# Patient Record
Sex: Female | Born: 1960 | Hispanic: No | Marital: Married | Smoking: Never smoker
Health system: Southern US, Community
[De-identification: ages and names within clinical notes are randomized; demographics above are authoritative.]

## PROBLEM LIST (undated history)

## (undated) DIAGNOSIS — F32A Depression, unspecified: Secondary | ICD-10-CM

## (undated) DIAGNOSIS — E039 Hypothyroidism, unspecified: Secondary | ICD-10-CM

## (undated) DIAGNOSIS — F329 Major depressive disorder, single episode, unspecified: Secondary | ICD-10-CM

## (undated) HISTORY — DX: Depression, unspecified: F32.A

## (undated) HISTORY — DX: Major depressive disorder, single episode, unspecified: F32.9

## (undated) HISTORY — PX: SPLENECTOMY: SUR1306

---

## 1982-05-05 HISTORY — PX: TONSILLECTOMY: SUR1361

## 2012-11-22 ENCOUNTER — Ambulatory Visit (INDEPENDENT_AMBULATORY_CARE_PROVIDER_SITE_OTHER): Payer: BC Managed Care – PPO | Admitting: Endocrinology

## 2012-11-22 ENCOUNTER — Encounter: Payer: Self-pay | Admitting: Endocrinology

## 2012-11-22 VITALS — BP 160/80 | HR 88 | Temp 98.9°F | Resp 12 | Ht 70.0 in | Wt 280.9 lb

## 2012-11-22 DIAGNOSIS — E039 Hypothyroidism, unspecified: Secondary | ICD-10-CM

## 2012-11-22 DIAGNOSIS — F329 Major depressive disorder, single episode, unspecified: Secondary | ICD-10-CM

## 2012-11-22 DIAGNOSIS — R030 Elevated blood-pressure reading, without diagnosis of hypertension: Secondary | ICD-10-CM

## 2012-11-22 NOTE — Patient Instructions (Addendum)
Reduce dose to 1/2 tab on Sundays

## 2012-11-22 NOTE — Progress Notes (Signed)
Patient ID: Leslie Hunter, female   DOB: [birth year], 52 y.o.   MRN: [id number]  Reason for Appointment:  Hypothyroidism, new consultation    History of Present Illness:   Her HYPOTHYROIDISM was first diagnosed at age 16 with symptoms of fatigue, sleepiness, lethargy, constipation, dry stringy hair. She was diagnosed by her pediatrician and started on thyroid supplementation with resolution of her symptoms  She has been followed by her primary care physician mostly in the last few years but had not seen her PCP for about 2 years. Recently was seen by her gynecologist  The symptoms consistent with hypothyroidism at this time are: fatigue,  can't seem to lose weight, feeling foggy mentally, but no dry skin; she says her hair and nails are brittle. She has had the symptoms for 2-3 months   The treatments that the patient has taken include Synthroid brand for the last 2-3 years and is on her maximum dose of 200 mcg for this duration  No goiter diagnosed previously but her gynecologist thought there was an enlargement in her thyroid area and referred her here for evaluation. Patient is rather concerned about possible mass in her thyroid  Last TSH was checked 2 weeks ago which was 0.18 done by her gynecologist, free T4 is normal at 0.94           Compliance with the medical regimen has been as prescribed with taking the tablet in the morning before breakfast.     Medication List       This list is accurate as of: 11/22/12 11:59 PM.  Always use your most recent med list.               DULoxetine 60 MG capsule  Commonly known as:  CYMBALTA     SYNTHROID 200 MCG tablet  Generic drug:  levothyroxine         Past Medical History  Diagnosis Date  . Depression     Past Surgical History  Procedure Laterality Date  . Splenectomy N/A     ITP    Family History  Problem Relation Age of Onset  . Thyroid disease Paternal Aunt   . Cancer Mother   . Diabetes Father   . Hypertension  Father     Social History:  reports that she has never smoked. She does not have any smokeless tobacco history on file. Her alcohol and drug histories are not on file.  Allergies:  Allergies  Allergen Reactions  . Compazine (Prochlorperazine Edisylate)     nightmares   REVIEW of systems:  She has a history of ITP treated with splenectomy, recent platelet count was over 400,000  No menstrual period in 7/14 otherwise has been having regular menstrual cycles, no recent hot flashes  No history of hypertension or diabetes  No difficulty with constipation or diarrhea  She has a history of depression and anxiety treated with Cymbalta  History of tingling or numbness in her hands or feet  No history of pedal edema   Examination:   BP 160/80  Pulse 88  Temp(Src) 98.9 F (37.2 C)  Resp 12  Ht 5\' 10"  (1.778 m)  Wt 280 lb 14.4 oz (127.415 kg)  BMI 40.3 kg/m2  SpO2 96%  LMP 10/03/2012   GENERAL APPEARANCE:  well-nourished and pleasant. No cushingoid features  No puffiness of face or loss of eyebrows, no obvious alopecia or hirsutism. No proptosis or swelling of the eyelids  NECK: no thyromegaly, right lobe may be just palpable  and slightly firm but no nodules felt in either lobe Abdomen shows no hepatomegaly Heart sounds normal  NEUROLOGIC EXAM: DTRs 2+ bilaterally at biceps.    Assessments 1. Hypothyroidism - 244.9   Her hypothyroidism has been present since childhood. She very likely has hypothyroidism related to autoimmune thyroid disease and Hashimoto thyroiditis. She has been very compliant with brand name Synthroid Subjectively she has only minimal symptoms with nonspecific fatigue and hair and nail changes Currently her TSH is mildly suppressed at 0.18 with normal free T4  2. Recommended continuing followup with PCP for general care, also would consider bone density for screening if not already done  3. Increased blood pressure, may be from anxiety but needs  followup since her blood pressure was high at 142/90 with gynecologist also   Treatment:  Since her TSH is only mildly suppressed will first reduce her dose by 100 mcg per week and have followup in 2 months Consider reducing the dosage to 175 or less if TSH is still low Emphasized the need for at least annual monitoring of her thyroid levels and adjustment of thyroid dosage  Leslie Hunter 11/23/2012, 3:00 PM

## 2012-11-23 ENCOUNTER — Encounter: Payer: Self-pay | Admitting: Endocrinology

## 2012-11-23 DIAGNOSIS — F329 Major depressive disorder, single episode, unspecified: Secondary | ICD-10-CM | POA: Insufficient documentation

## 2013-01-19 ENCOUNTER — Other Ambulatory Visit: Payer: BC Managed Care – PPO

## 2013-01-26 ENCOUNTER — Ambulatory Visit: Payer: BC Managed Care – PPO | Admitting: Endocrinology

## 2013-01-26 DIAGNOSIS — Z0289 Encounter for other administrative examinations: Secondary | ICD-10-CM

## 2013-10-27 ENCOUNTER — Telehealth: Payer: Self-pay | Admitting: *Deleted

## 2013-10-27 NOTE — Telephone Encounter (Signed)
Pt said she was referred by Dr. Olena LeatherwoodHasanaj for a TCS pt wants Dr. Jena Gaussourk to do it. Please advise [phone]

## 2013-11-03 NOTE — Telephone Encounter (Signed)
I called pt and got some of the information for triage. Pt to call back with insurance info.

## 2013-11-11 ENCOUNTER — Encounter (INDEPENDENT_AMBULATORY_CARE_PROVIDER_SITE_OTHER): Payer: Self-pay | Admitting: *Deleted

## 2013-11-15 ENCOUNTER — Other Ambulatory Visit: Payer: Self-pay

## 2013-11-15 DIAGNOSIS — Z1211 Encounter for screening for malignant neoplasm of colon: Secondary | ICD-10-CM

## 2013-11-15 NOTE — Telephone Encounter (Signed)
Pt is scheduled for 11/23/2013 at 1:45 PM with Dr. Jena Gaussourk.

## 2013-11-15 NOTE — Telephone Encounter (Signed)
LMOM for a return call.  

## 2013-11-16 ENCOUNTER — Telehealth (INDEPENDENT_AMBULATORY_CARE_PROVIDER_SITE_OTHER): Payer: Self-pay | Admitting: *Deleted

## 2013-11-16 NOTE — Telephone Encounter (Signed)
Gastroenterology Pre-Procedure Review  Request Date: 11/23/2013 Requesting Physician: Hasanaj  PATIENT REVIEW QUESTIONS: The patient responded to the following health history questions as indicated:    1. Diabetes Melitis: no 2. Joint replacements in the past 12 months: no 3. Major health problems in the past 3 months: no 4. Has an artificial valve or MVP: no 5. Has a defibrillator: no 6. Has been advised in past to take antibiotics in advance of a procedure like teeth cleaning: no    MEDICATIONS & ALLERGIES:    Patient reports the following regarding taking any blood thinners:   Plavix? no Aspirin? no Coumadin? no  Patient confirms/reports the following medications:  Current Outpatient Prescriptions  Medication Sig Dispense Refill  . DULoxetine (CYMBALTA) 30 MG capsule Take 30 mg by mouth daily.      Marland Kitchen. levothyroxine (SYNTHROID, LEVOTHROID) 125 MCG tablet Take 125 mcg by mouth daily before breakfast.       No current facility-administered medications for this visit.    Patient confirms/reports the following allergies:  Allergies  Allergen Reactions  . Compazine [Prochlorperazine Edisylate]     nightmares    No orders of the defined types were placed in this encounter.    AUTHORIZATION INFORMATION Primary Insurance:   ID #:   Group #:  Pre-Cert / Auth required:  Pre-Cert / Auth #:   Secondary Insurance:   ID #:   Group #:  Pre-Cert / Auth required:  Pre-Cert / Auth #:   SCHEDULE INFORMATION: Procedure has been scheduled as follows:  Date:  11/23/2013                  Time:  1:45 PM [facility]  This Gastroenterology Pre-Precedure Review Form is being routed to the following provider(s): R. Roetta SessionsMichael Rourk, MD

## 2013-11-16 NOTE — Telephone Encounter (Signed)
Per Dr.Rehman the patient will need to have an appointment with him 11/24/13.

## 2013-11-17 NOTE — Telephone Encounter (Signed)
Patient should keep her appointment with Dr. Jena Gaussourk. I did not know that she had was appointment already made

## 2013-11-17 NOTE — Telephone Encounter (Signed)
Leslie Hunter has a TCS scheduled with Dr. Jena Gaussourk next Wednesday, 11/23/13. She doesn't understand the nature of this call. Patient said she would like to leave it as is unless Dr. Karilyn Cotaehman said otherwise.

## 2013-11-21 ENCOUNTER — Telehealth: Payer: Self-pay | Admitting: Internal Medicine

## 2013-11-21 MED ORDER — PEG-KCL-NACL-NASULF-NA ASC-C 100 G PO SOLR: 1.0000 | ORAL | Status: DC

## 2013-11-21 NOTE — Telephone Encounter (Signed)
Pt is scheduled for tcs on Wednesday with RMR and said that her pharmacy has not received her prescription for her prep yet. [facility]

## 2013-11-21 NOTE — Telephone Encounter (Signed)
Pt called and is aware that she will need to start clear liquids today. I will fax the Rx for the Movie prep now, so she can see if she wants to purchase it. She will call me back later today and let me know if she plans to get it or if I need to send in cheaper prep to [facility] by fax.

## 2013-11-21 NOTE — Telephone Encounter (Signed)
  Waiting on Gerrit HallsAnna Sams, NP to sign off on so I can send it.

## 2013-11-21 NOTE — Telephone Encounter (Signed)
Pt said the Movie prep is covered and to fax the instructions to [facility]. She is aware to start clear liquids after 2:00 PM today.

## 2013-11-21 NOTE — Telephone Encounter (Signed)
Appropriate.

## 2013-11-21 NOTE — Telephone Encounter (Signed)
I tried to call pt to inform her to start clear liquids today at 2:00 pm. Many rings and no answer at home and [facility] for Lawson FiscalAlan Dominik, spouse, for the emergency contact.

## 2013-11-21 NOTE — Telephone Encounter (Signed)
Rx and instructions sent to [facility].

## 2013-11-23 ENCOUNTER — Ambulatory Visit (HOSPITAL_COMMUNITY)
Admission: RE | Admit: 2013-11-23 | Discharge: 2013-11-23 | Disposition: A | Discharge status: Home / Self Care | Payer: BC Managed Care – PPO | Source: Ambulatory Visit | Attending: Internal Medicine | Admitting: Internal Medicine

## 2013-11-23 ENCOUNTER — Encounter (HOSPITAL_COMMUNITY)
Admission: RE | Disposition: A | Discharge status: Home / Self Care | Payer: Self-pay | Source: Ambulatory Visit | Attending: Internal Medicine

## 2013-11-23 ENCOUNTER — Encounter (HOSPITAL_COMMUNITY): Payer: Self-pay | Admitting: *Deleted

## 2013-11-23 DIAGNOSIS — Z1211 Encounter for screening for malignant neoplasm of colon: Secondary | ICD-10-CM

## 2013-11-23 DIAGNOSIS — F3289 Other specified depressive episodes: Secondary | ICD-10-CM | POA: Insufficient documentation

## 2013-11-23 DIAGNOSIS — F329 Major depressive disorder, single episode, unspecified: Secondary | ICD-10-CM | POA: Insufficient documentation

## 2013-11-23 DIAGNOSIS — K5730 Diverticulosis of large intestine without perforation or abscess without bleeding: Secondary | ICD-10-CM | POA: Insufficient documentation

## 2013-11-23 DIAGNOSIS — E039 Hypothyroidism, unspecified: Secondary | ICD-10-CM | POA: Insufficient documentation

## 2013-11-23 DIAGNOSIS — Z79899 Other long term (current) drug therapy: Secondary | ICD-10-CM | POA: Insufficient documentation

## 2013-11-23 HISTORY — DX: Hypothyroidism, unspecified: E03.9

## 2013-11-23 HISTORY — PX: COLONOSCOPY: SHX5424

## 2013-11-23 SURGERY — COLONOSCOPY
Anesthesia: Moderate Sedation

## 2013-11-23 MED ORDER — STERILE WATER FOR IRRIGATION IR SOLN
Status: DC | PRN
  Administered 2013-11-23: 14:00:00

## 2013-11-23 MED ORDER — ONDANSETRON HCL 4 MG/2ML IJ SOLN
INTRAMUSCULAR | Status: AC
  Filled 2013-11-23: qty 2

## 2013-11-23 MED ORDER — ONDANSETRON HCL 4 MG/2ML IJ SOLN
INTRAMUSCULAR | Status: DC | PRN
  Administered 2013-11-23: 4 mg via INTRAVENOUS

## 2013-11-23 MED ORDER — MEPERIDINE HCL 100 MG/ML IJ SOLN
INTRAMUSCULAR | Status: DC | PRN
  Administered 2013-11-23: 50 mg via INTRAVENOUS
  Administered 2013-11-23 (×2): 25 mg via INTRAVENOUS
  Administered 2013-11-23: 50 mg via INTRAVENOUS

## 2013-11-23 MED ORDER — SODIUM CHLORIDE 0.9 % IV SOLN
INTRAVENOUS | Status: DC
  Administered 2013-11-23: 14:00:00 via INTRAVENOUS

## 2013-11-23 MED ORDER — MEPERIDINE HCL 100 MG/ML IJ SOLN
INTRAMUSCULAR | Status: AC
  Filled 2013-11-23: qty 2

## 2013-11-23 MED ORDER — MIDAZOLAM HCL 5 MG/5ML IJ SOLN
INTRAMUSCULAR | Status: DC
Start: 2013-11-23 — End: 2013-11-23
  Filled 2013-11-23: qty 10

## 2013-11-23 MED ORDER — MIDAZOLAM HCL 5 MG/5ML IJ SOLN
INTRAMUSCULAR | Status: DC | PRN
  Administered 2013-11-23: 1 mg via INTRAVENOUS
  Administered 2013-11-23: 2 mg via INTRAVENOUS
  Administered 2013-11-23 (×2): 1 mg via INTRAVENOUS
  Administered 2013-11-23: 2 mg via INTRAVENOUS

## 2013-11-23 NOTE — Discharge Instructions (Signed)

## 2013-11-23 NOTE — H&P (Deleted)
$'@LOGO'P$ @   Primary Care Physician:  Elayne Snare, MD  Dr. Zada Girt Primary Gastroenterologist:  Dr. Gala Romney  Pre-Procedure History & Physical: HPI:  Leslie Hunter is a 53 y.o. female is here for a screening colonoscopy. Average risk screening examination. Reports having colonoscopy 20 years ago for rectal bleeding.-Hemorrhoids. No bowel symptoms now. No family history of colon cancer.  Past Medical History  Diagnosis Date  . Depression   . Hypothyroidism     Past Surgical History  Procedure Laterality Date  . Splenectomy N/A     ITP  . Tonsillectomy  1984    Prior to Admission medications   Medication Sig Start Date End Date Taking? Authorizing Provider  DULoxetine (CYMBALTA) 30 MG capsule Take 30 mg by mouth daily.   Yes Historical Provider, MD  levothyroxine (SYNTHROID, LEVOTHROID) 125 MCG tablet Take 125 mcg by mouth daily before breakfast.   Yes Historical Provider, MD  peg 3350 powder (MOVIPREP) 100 G SOLR Take 1 kit (200 g total) by mouth as directed. 11/21/13  Yes Daneil Dolin, MD    Allergies as of 11/15/2013 - Review Complete 11/03/2013  Allergen Reaction Noted  . Compazine [prochlorperazine edisylate]  11/22/2012    Family History  Problem Relation Age of Onset  . Thyroid disease Paternal Aunt   . Cancer Mother   . Diabetes Father   . Hypertension Father   . Colon cancer Neg Hx     History   Social History  . Marital Status: Married    Spouse Name: N/A    Number of Children: N/A  . Years of Education: N/A   Occupational History  . Not on file.   Social History Main Topics  . Smoking status: Never Smoker   . Smokeless tobacco: Not on file  . Alcohol Use: No  . Drug Use: No  . Sexual Activity: Not on file   Other Topics Concern  . Not on file   Social History Narrative  . No narrative on file    Review of Systems: See HPI, otherwise negative ROS  Physical Exam: BP 151/84  Temp(Src) 98.4 F (36.9 C) (Oral)  Resp 16  Ht $R'5\' 10"'pe$  (1.778 m)   Wt 260 lb (117.935 kg)  BMI 37.31 kg/m2  SpO2 94%  LMP 10/03/2012 General:   Alert,  Well-developed, well-nourished, pleasant and cooperative in NAD Head:  Normocephalic and atraumatic. Eyes:  Sclera clear, no icterus.   Conjunctiva pink. Ears:  Normal auditory acuity. Nose:  No deformity, discharge,  or lesions. Mouth:  No deformity or lesions, dentition normal. Neck:  Supple; no masses or thyromegaly. Lungs:  Clear throughout to auscultation.   No wheezes, crackles, or rhonchi. No acute distress. Heart:  Regular rate and rhythm; no murmurs, clicks, rubs,  or gallops. Abdomen:  Soft, nontender and nondistended. No masses, hepatosplenomegaly or hernias noted. Normal bowel sounds, without guarding, and without rebound.   Msk:  Symmetrical without gross deformities. Normal posture. Pulses:  Normal pulses noted. Extremities:  Without clubbing or edema. Neurologic:  Alert and  oriented x4;  grossly normal neurologically. Skin:  Intact without significant lesions or rashes. Cervical Nodes:  No significant cervical adenopathy. Psych:  Alert and cooperative. Normal mood and affect.  Impression/Plan: [name] is now here to undergo a screening colonoscopy.   Average risk screening examination.  Risks, benefits, limitations, imponderables and alternatives regarding colonoscopy have been reviewed with the patient. Questions have been answered. All parties agreeable.     Notice:  This dictation was  prepared with Dragon dictation along with smaller phrase technology. Any transcriptional errors that result from this process are unintentional and may not be corrected upon review.

## 2013-11-23 NOTE — Op Note (Signed)
[facility] [address] [place], [id number]   COLONOSCOPY PROCEDURE REPORT  PATIENT: Leslie Hunter, Luciel  MR#:         [id number] BIRTHDATE: 1961/04/21 , 53  yrs. old GENDER: Female ENDOSCOPIST: R.  Roetta SessionsMichael Jacarie Pate, MD FACP FACG REFERRED BY:  Germain OsgoodXaje Hasani, M.D. PROCEDURE DATE:  11/23/2013 PROCEDURE:     Screening colonoscopy  INDICATIONS: Average risk colorectal cancer screening examination  INFORMED CONSENT:  The risks, benefits, alternatives and imponderables including but not limited to bleeding, perforation as well as the possibility of a missed lesion have been reviewed.  The potential for biopsy, lesion removal, etc. have also been discussed.  Questions have been answered.  All parties agreeable. Please see the history and physical in the medical record for more information.  MEDICATIONS: Versed 7 mg IV and Demerol 150 mg IV in divided doses. Zofran 4 mg IV.  DESCRIPTION OF PROCEDURE:  After a digital rectal exam was performed, the EC-[id number] ([id number]  colonoscope was advanced from the anus through the rectum and colon to the area of the cecum, ileocecal valve and appendiceal orifice.  The cecum was deeply intubated.  These structures were well-seen and photographed for the record.  From the level of the cecum and ileocecal valve, the scope was slowly and cautiously withdrawn.  The mucosal surfaces were carefully surveyed utilizing scope tip deflection to facilitate fold flattening as needed.  The scope was pulled down into the rectum where a thorough examination including retroflexion was performed.    FINDINGS:  Adequate preparation. Normal rectum. Normal-appearing colonic mucosa except for a single ascending colon diverticulum.  THERAPEUTIC / DIAGNOSTIC MANEUVERS PERFORMED:  none  COMPLICATIONS: none  CECAL WITHDRAWAL TIME:  9 minutes  IMPRESSION:  Single colonic diverticulum;, otherwise normal colonoscopy  RECOMMENDATIONS: Repeat screening  colonoscopy in 10 years   _______________________________ eSigned:  R. Roetta SessionsMichael Charnele Semple, MD FACP [facility] 11/23/2013 2:44 PM   CC:

## 2013-11-25 ENCOUNTER — Encounter (HOSPITAL_COMMUNITY): Payer: Self-pay | Admitting: Internal Medicine

## 2013-12-07 NOTE — H&P (Signed)
_0 @   Primary Care Physician:  Elayne Snare, MD  Dr. Zada Girt Primary Gastroenterologist:  Dr. Gala Romney  Pre-Procedure History & Physical: HPI:  Leslie Hunter is a 53 y.o. female is here for a screening colonoscopy. Average risk screening examination. Reports having colonoscopy 20 years ago for rectal bleeding.-Hemorrhoids. No bowel symptoms now. No family history of colon cancer.  Past Medical History  Diagnosis Date  . Depression   . Hypothyroidism     Past Surgical History  Procedure Laterality Date  . Splenectomy N/A     ITP  . Tonsillectomy  1984  . Colonoscopy N/A 11/23/2013    Procedure: COLONOSCOPY;  Surgeon: Daneil Dolin, MD;  Location: [facility];  Service: Endoscopy;  Laterality: N/A;  1:45 PM    Prior to Admission medications   Medication Sig Start Date End Date Taking? Authorizing Provider  DULoxetine (CYMBALTA) 30 MG capsule Take 30 mg by mouth daily.   Yes Historical Provider, MD  levothyroxine (SYNTHROID, LEVOTHROID) 125 MCG tablet Take 125 mcg by mouth daily before breakfast.   Yes Historical Provider, MD  peg 3350 powder (MOVIPREP) 100 G SOLR Take 1 kit (200 g total) by mouth as directed. 11/21/13  Yes Daneil Dolin, MD    Allergies as of 11/15/2013 - Review Complete 11/03/2013  Allergen Reaction Noted  . Compazine [prochlorperazine edisylate]  11/22/2012    Family History  Problem Relation Age of Onset  . Thyroid disease Paternal Aunt   . Cancer Mother   . Diabetes Father   . Hypertension Father   . Colon cancer Neg Hx     History   Social History  . Marital Status: Married    Spouse Name: N/A    Number of Children: N/A  . Years of Education: N/A   Occupational History  . Not on file.   Social History Main Topics  . Smoking status: Never Smoker   . Smokeless tobacco: Not on file  . Alcohol Use: No  . Drug Use: No  . Sexual Activity: Not on file   Other Topics Concern  . Not on file   Social History Narrative  . No narrative on  file    Review of Systems: See HPI, otherwise negative ROS  Physical Exam: BP 172/90  Pulse 76  Temp(Src) 98.4 F (36.9 C) (Oral)  Resp 20  Ht _1  (1.778 m)  Wt 260 lb (117.935 kg)  BMI 37.31 kg/m2  SpO2 92%  LMP 10/03/2012 General:   Alert,  Well-developed, well-nourished, pleasant and cooperative in NAD Head:  Normocephalic and atraumatic. Eyes:  Sclera clear, no icterus.   Conjunctiva pink. Ears:  Normal auditory acuity. Nose:  No deformity, discharge,  or lesions. Mouth:  No deformity or lesions, dentition normal. Neck:  Supple; no masses or thyromegaly. Lungs:  Clear throughout to auscultation.   No wheezes, crackles, or rhonchi. No acute distress. Heart:  Regular rate and rhythm; no murmurs, clicks, rubs,  or gallops. Abdomen:  Soft, nontender and nondistended. No masses, hepatosplenomegaly or hernias noted. Normal bowel sounds, without guarding, and without rebound.   Msk:  Symmetrical without gross deformities. Normal posture. Pulses:  Normal pulses noted. Extremities:  Without clubbing or edema. Neurologic:  Alert and  oriented x4;  grossly normal neurologically. Skin:  Intact without significant lesions or rashes. Cervical Nodes:  No significant cervical adenopathy. Psych:  Alert and cooperative. Normal mood and affect.  Impression/Plan: Leslie Hunter is now here to undergo a screening colonoscopy.   Average risk  screening examination.  Risks, benefits, limitations, imponderables and alternatives regarding colonoscopy have been reviewed with the patient. Questions have been answered. All parties agreeable.     Notice:  This dictation was prepared with Dragon dictation along with smaller phrase technology. Any transcriptional errors that result from this process are unintentional and may not be corrected upon review.

## 2016-06-26 ENCOUNTER — Ambulatory Visit (INDEPENDENT_AMBULATORY_CARE_PROVIDER_SITE_OTHER): Payer: BC Managed Care – PPO | Admitting: Otolaryngology

## 2016-07-28 ENCOUNTER — Ambulatory Visit (INDEPENDENT_AMBULATORY_CARE_PROVIDER_SITE_OTHER): Payer: BC Managed Care – PPO | Admitting: Otolaryngology

## 2016-07-28 DIAGNOSIS — R040 Epistaxis: Secondary | ICD-10-CM

## 2017-02-12 ENCOUNTER — Other Ambulatory Visit (HOSPITAL_COMMUNITY): Payer: Self-pay | Admitting: Internal Medicine

## 2017-02-12 DIAGNOSIS — Z1231 Encounter for screening mammogram for malignant neoplasm of breast: Secondary | ICD-10-CM

## 2017-02-16 ENCOUNTER — Ambulatory Visit (HOSPITAL_COMMUNITY)
Admission: RE | Admit: 2017-02-16 | Discharge: 2017-02-16 | Disposition: A | Discharge status: Home / Self Care | Payer: BC Managed Care – PPO | Source: Ambulatory Visit | Attending: Internal Medicine | Admitting: Internal Medicine

## 2017-02-16 DIAGNOSIS — Z1231 Encounter for screening mammogram for malignant neoplasm of breast: Secondary | ICD-10-CM | POA: Insufficient documentation

## 2018-09-28 IMAGING — MG 2D DIGITAL SCREENING BILATERAL MAMMOGRAM WITH CAD AND ADJUNCT TO
8 series · 8 of 24 positions shown · non-contrast
Comparison: Previous exam(s).

CLINICAL DATA: Screening.

EXAM:
2D DIGITAL SCREENING BILATERAL MAMMOGRAM WITH CAD AND ADJUNCT TOMO

[R MLO]
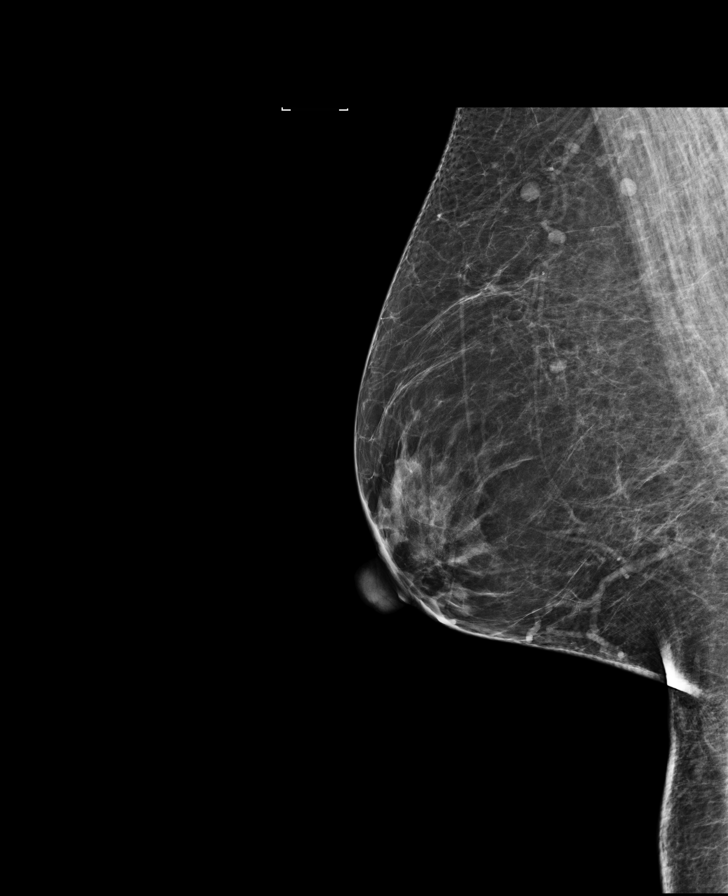

[L MLO]
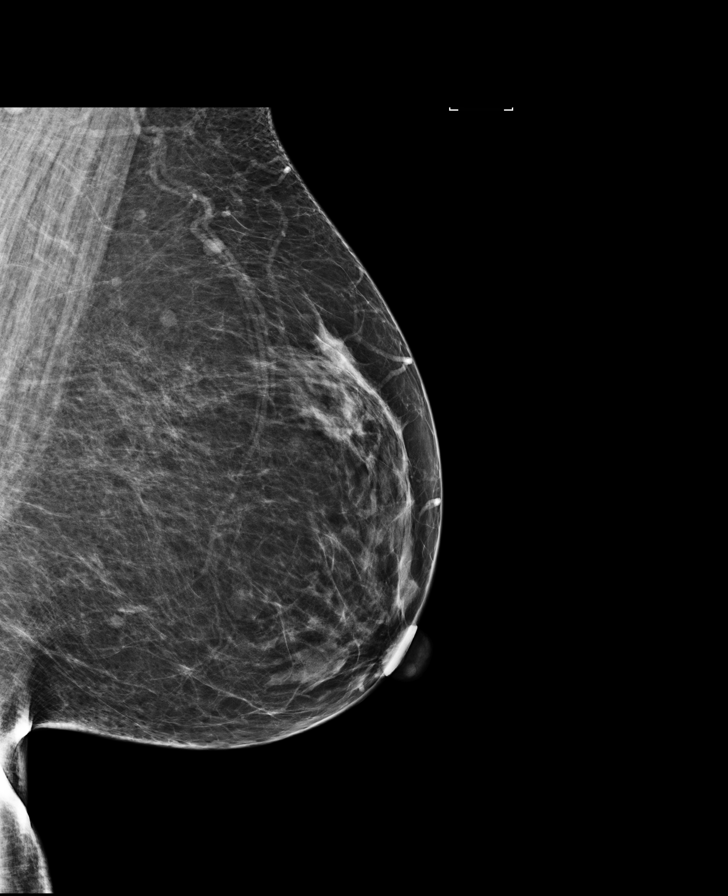

[L CC]
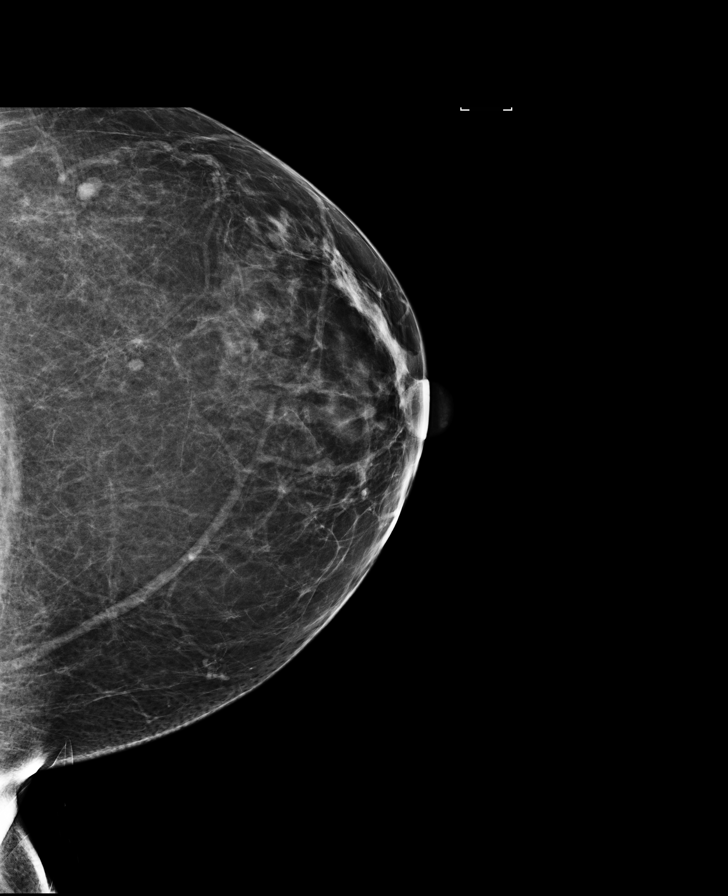

[R CC]
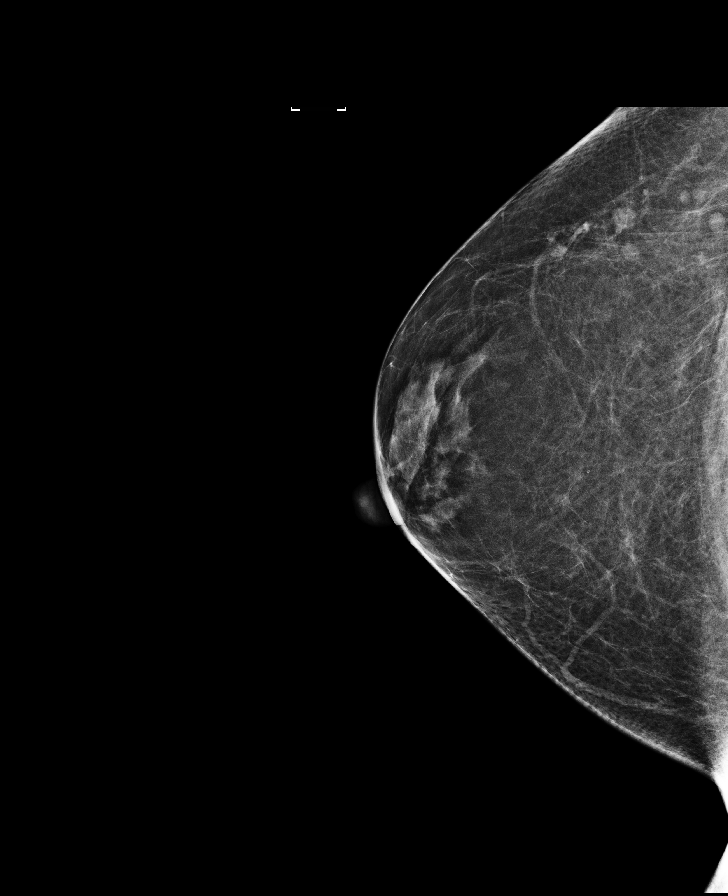

[R MLO tomo · tomo slice 39/77.0]
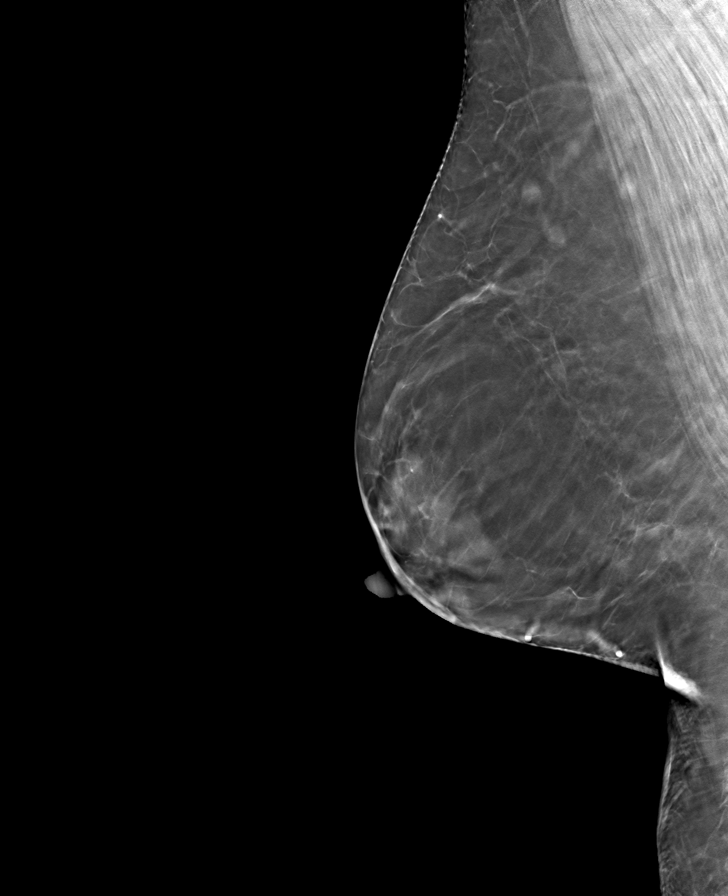

[R CC tomo · tomo slice 42/83.0]
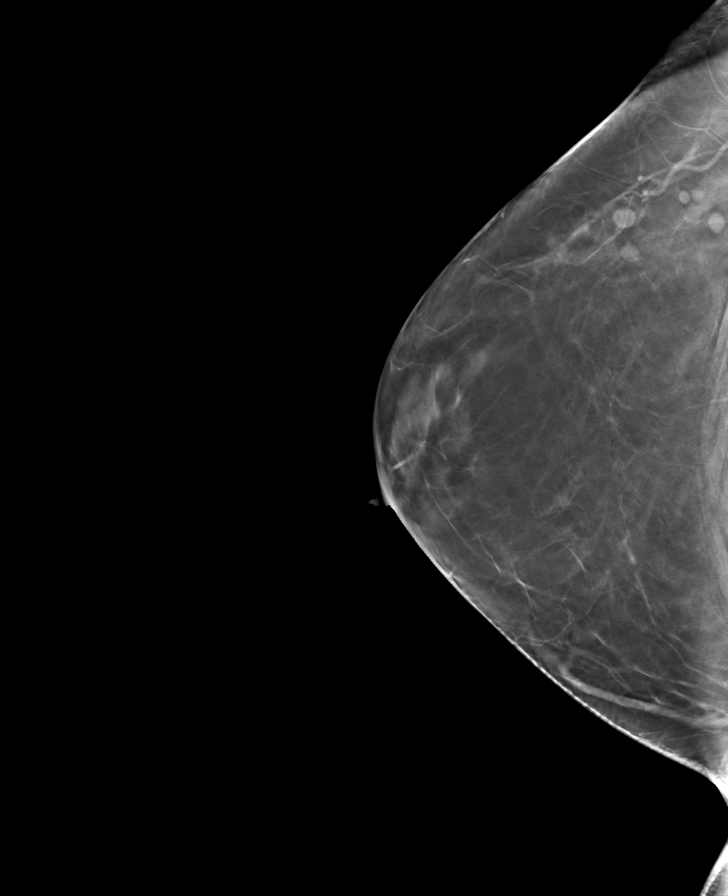

[L CC tomo · tomo slice 40/79.0]
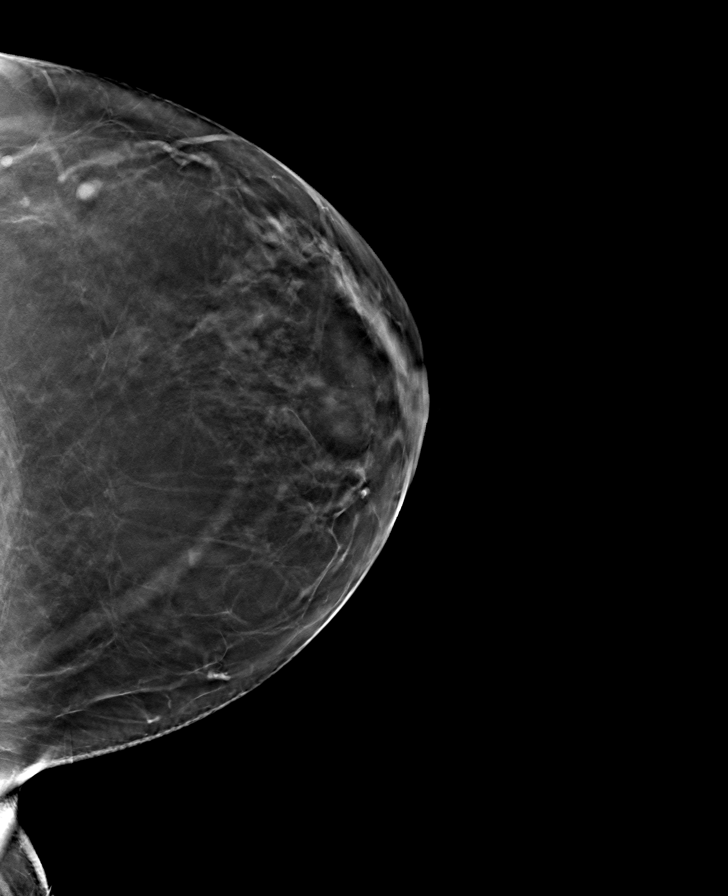

[L MLO tomo · tomo slice 41/82.0]
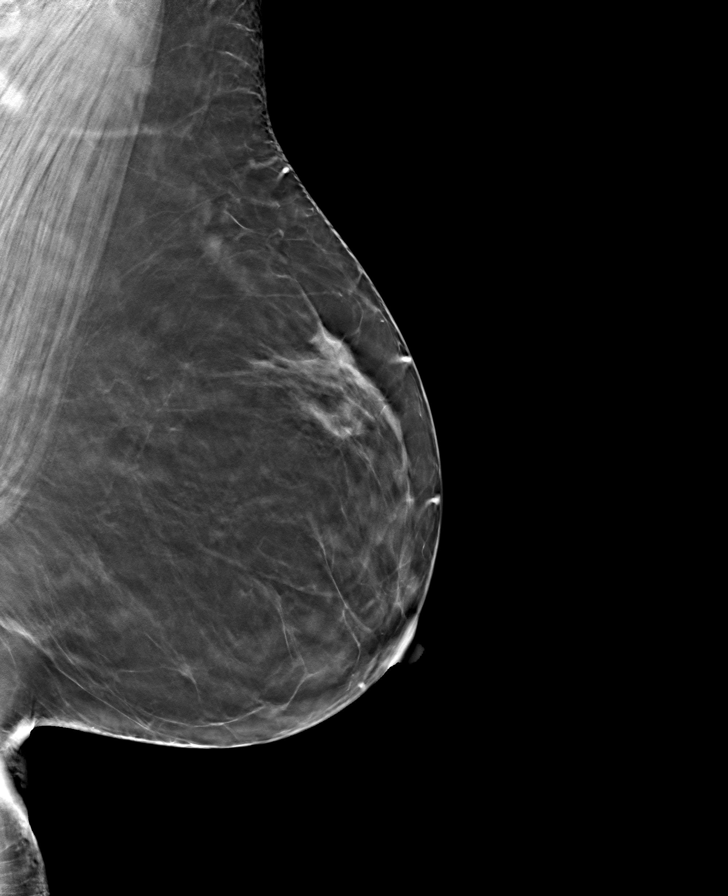

[8 of 24 positions shown; findings below may reference images not displayed]

ACR Breast Density Category b: There are scattered areas of
fibroglandular density.
FINDINGS: There are no findings suspicious for malignancy. Images were
processed with CAD.
IMPRESSION: No mammographic evidence of malignancy. A result letter of this
screening mammogram will be mailed directly to the patient.

RECOMMENDATION:
Screening mammogram in one year. (Code:97-6-RS4)

BI-RADS CATEGORY  1: Negative.

## 2019-07-10 ENCOUNTER — Ambulatory Visit: Payer: BC Managed Care – PPO

## 2022-06-13 ENCOUNTER — Encounter: Payer: Self-pay | Admitting: *Deleted

## 2022-06-13 ENCOUNTER — Encounter: Payer: Self-pay | Admitting: Internal Medicine

## 2022-06-16 ENCOUNTER — Ambulatory Visit: Payer: BC Managed Care – PPO | Admitting: Internal Medicine

## 2022-07-09 ENCOUNTER — Encounter: Payer: Self-pay | Admitting: Internal Medicine

## 2022-07-09 ENCOUNTER — Encounter: Payer: Self-pay | Admitting: *Deleted

## 2022-07-09 ENCOUNTER — Ambulatory Visit: Payer: BC Managed Care – PPO | Attending: Internal Medicine | Admitting: Internal Medicine

## 2022-07-09 VITALS — BP 126/90 | HR 96 | Ht 70.0 in | Wt 306.8 lb

## 2022-07-09 DIAGNOSIS — I10 Essential (primary) hypertension: Secondary | ICD-10-CM

## 2022-07-09 DIAGNOSIS — I480 Paroxysmal atrial fibrillation: Secondary | ICD-10-CM | POA: Diagnosis not present

## 2022-07-09 DIAGNOSIS — G4733 Obstructive sleep apnea (adult) (pediatric): Secondary | ICD-10-CM | POA: Insufficient documentation

## 2022-07-09 DIAGNOSIS — I4891 Unspecified atrial fibrillation: Secondary | ICD-10-CM | POA: Insufficient documentation

## 2022-07-09 DIAGNOSIS — I4819 Other persistent atrial fibrillation: Secondary | ICD-10-CM | POA: Diagnosis not present

## 2022-07-09 HISTORY — DX: Essential (primary) hypertension: I10

## 2022-07-09 MED ORDER — ELIQUIS 5 MG PO TABS: 5.0000 mg | ORAL_TABLET | Freq: Two times a day (BID) | ORAL | 6 refills | Status: DC

## 2022-07-09 NOTE — Progress Notes (Signed)
Cardiology Office Note  Date: 07/09/2022   ID: Leslie Hunter, DOB Jan 22, 1961, MRN [id number]  PCP:  Elayne Snare, MD  Cardiologist:  None Electrophysiologist:  None   Reason for Office Visit: Evaluation of persistent atrial fibrillation the request of Dr. Sherrie Sport   History of Present Illness: Leslie Hunter is a 62 y.o. female known to have persistent A-fib (newly diagnosed in 04/2022), HTN, hypothyroidism was referred to cardiology clinic for evaluation of persistent atrial fibrillation.  Patient had recent ER visit in 04/2022 for palpitations and chest pain. EKG showed new onset atrial fibrillation, rate controlled. She was discharged on Eliquis 5 mg twice daily and she was already on nebivolol. She is off Eliquis for the last 2 weeks as she did not have any refills. Otherwise, she has palpitations here and there but not frequently. But she has been having fatigue for the last 6 months.  Denies DOE, dizziness, lightness, syncope.  Denies any angina.  Denies smoking cigarettes.  Her mother passed away when she was 15 years old and her aunt raised her.  Her aunt has atrial fibrillation and CHF.  Past Medical History:  Diagnosis Date   Depression    Hypothyroidism     Past Surgical History:  Procedure Laterality Date   COLONOSCOPY N/A 11/23/2013   Procedure: COLONOSCOPY;  Surgeon: Daneil Dolin, MD;  Location: [facility];  Service: Endoscopy;  Laterality: N/A;  1:45 PM   SPLENECTOMY N/A    ITP   TONSILLECTOMY  1984    Current Outpatient Medications  Medication Sig Dispense Refill   DULoxetine (CYMBALTA) 30 MG capsule Take 30 mg by mouth daily.     levothyroxine (SYNTHROID, LEVOTHROID) 125 MCG tablet Take 125 mcg by mouth daily before breakfast.     peg 3350 powder (MOVIPREP) 100 G SOLR Take 1 kit (200 g total) by mouth as directed. 1 kit 0   No current facility-administered medications for this visit.   Allergies:  Other and Compazine [prochlorperazine edisylate]   Social  History: The patient  reports that she has never smoked. She has never used smokeless tobacco. She reports that she does not drink alcohol and does not use drugs.   Family History: The patient's family history includes Cancer in her mother; Diabetes in her father; Hypertension in her father; Thyroid disease in her paternal aunt.   ROS:  Please see the history of present illness. Otherwise, complete review of systems is positive for none.  All other systems are reviewed and negative.   Physical Exam: VS:  Ht '5\' 10"'$  (1.778 m)   Wt (!) 306 lb 12.8 oz (139.2 kg)   LMP 10/03/2012   BMI 44.02 kg/m , BMI Body mass index is 44.02 kg/m.  Wt Readings from Last 3 Encounters:  07/09/22 (!) 306 lb 12.8 oz (139.2 kg)  11/23/13 260 lb (117.9 kg)  11/22/12 280 lb 14.4 oz (127.4 kg)    General: Patient appears comfortable at rest. HEENT: Conjunctiva and lids normal, oropharynx clear with moist mucosa. Neck: Supple, no elevated JVP or carotid bruits, no thyromegaly. Lungs: Clear to auscultation, nonlabored breathing at rest. Cardiac: Regular rate and rhythm, no S3 or significant systolic murmur, no pericardial rub. Abdomen: Soft, nontender, no hepatomegaly, bowel sounds present, no guarding or rebound. Extremities: No pitting edema, distal pulses 2+. Skin: Warm and dry. Musculoskeletal: No kyphosis. Neuropsychiatric: Alert and oriented x3, affect grossly appropriate.  ECG:  An ECG dated 07/09/2022 was personally reviewed today and demonstrated:  Atrial fibrillation,  rate controlled  Recent Labwork: No results found for requested labs within last 365 days.  No results found for: "CHOL", "TRIG", "HDL", "CHOLHDL", "VLDL", "LDLCALC", "LDLDIRECT"  Other Studies Reviewed Today:   Assessment and Plan:  Patient is a 62 year old F known to have persistent atrial fibrillation (newly diagnosed in 12/23), HTN, hypothyroidism was referred to cardiology clinic for evaluation of persistent A-fib.  #  Persistent A-fib -Patient was newly diagnosed with rate controlled atrial fibrillation in 04/2022. She has been having fatigue for the last 6 months. EKG from 04/2022 and today in the clinic showed atrial fibrillation. On and off palpitations. Patient will benefit from rhythm control strategy, DCCV.  She is currently off Eliquis for the last 2 weeks. Will resume Eliquis 5 mg twice daily and scheduled for DCCV after 5 weeks. Patient is instructed not to miss any doses. She voiced understanding. After DCCV, amiodarone regimen will be started (amiodarone 200 mg twice daily for 3 weeks followed by amiodarone 200 mg once daily to maintain NSR). -Continue nebivolol 20 mg once daily -Resume Eliquis 5 mg twice daily (co-pay $47) -Treat underlying risk factors, STOP-BANG score is 6. She will benefit from sleep apnea testing.  Currently treated for hypothyroidism, obtain TSH.  (Can be done with PCP) Informed consent for DCCV The risks, benefits and alternatives for the DCCV procedure were discussed and the patient comprehended these risks. Risks include, but are not limited to, chest pain, aspiration, unsuccessful cardioversion, cardiac arrest and possibility of PPM implantation.  # HTN, controlled -Continue nebivolol 20 mg once daily.  Patient not taking losartan anymore.  # OSA screening -Due to new onset atrial fibrillation and STOP-BANG score 6, she will benefit from sleep apnea testing, patient preferred home sleep study.  If negative, she understands that she will need to undergo gold standard testing of lab sleep study.  I have spent a total of 45 minutes with patient reviewing chart, EKGs, labs and examining patient as well as establishing an assessment and plan that was discussed with the patient.  > 50% of time was spent in direct patient care.     Medication Adjustments/Labs and Tests Ordered: Current medicines are reviewed at length with the patient today.  Concerns regarding medicines are  outlined above.   Tests Ordered: Orders Placed This Encounter  Procedures   EKG 12-Lead    Medication Changes: No orders of the defined types were placed in this encounter.   Disposition:  Follow up  2 months  Signed Rebekah Sprinkle Fidel Levy, MD, 07/09/2022 8:05 AM    [facility], [address], [place] [address]

## 2022-07-09 NOTE — Progress Notes (Signed)
WatchPat sleep study ordered-please check precert

## 2022-07-09 NOTE — Patient Instructions (Addendum)
Medication Instructions:  Your physician recommends that you continue on your current medications as directed. Please refer to the Current Medication list given to you today. Eliquis prescription sent to [facility]  Labwork: none  Testing/Procedures: Your physician has recommended that you have a Cardioversion (DCCV). Electrical Cardioversion uses a jolt of electricity to your heart either through paddles or wired patches attached to your chest. This is a controlled, usually prescheduled, procedure. Defibrillation is done under light anesthesia in the hospital, and you usually go home the day of the procedure. This is done to get your heart back into a normal rhythm. You are not awake for the procedure. Please see the instruction sheet given to you today. WatchPAT?  Is a FDA cleared portable home sleep study test that uses a watch and 3 points of contact to monitor 7 different channels, including your heart rate, oxygen saturations, body position, snoring, and chest motion.  The study is easy to use from the comfort of your own home and accurately detect sleep apnea.  Before bed, you attach the chest sensor, attached the sleep apnea bracelet to your nondominant hand, and attach the finger probe.  After the study, the raw data is downloaded from the watch and scored for apnea events.   For more information: [email or web address]  Patient Testing Instructions:  Do not put battery into the device until bedtime when you are ready to begin the test. Please call the support number if you need assistance after following the instructions below: 24 hour support line- [phone] or [facility] support at [phone] (option 2)  Download the [employer] AmericanWatchPAT One" app through the google play store or App Store  Be sure to turn on or enable access to bluetooth in settlings on your smartphone/ device  Make sure no other bluetooth devices are on and within the vicinity of your smartphone/ device  and WatchPAT watch during testing.  Make sure to leave your smart phone/ device plugged in and charging all night.  When ready for bed:  Follow the instructions step by step in the WatchPAT One App to activate the testing device. For additional instructions, including video instruction, visit the WatchPAT One video on Youtube. You can search for [place] One within Youtube (video is 4 minutes and 18 seconds) or enter: https://youtube/watch?[email or web address]_vbiwxE Please note: You will be prompted to enter a Pin to connect via bluetooth when starting the test. The PIN will be assigned to you when you receive the test.  The device is disposable, but it recommended that you retain the device until you receive a call letting you know the study has been received and the results have been interpreted.  We will let you know if the study did not transmit to [place] properly after the test is completed. You do not need to call us to confirm the receipt of the test.  Please complete the test within 48 hours of receiving PIN.   Frequently Asked Questions:  What is Watch [place] one?  A single use fully disposable home sleep apnea testing device and will not need to be returned after completion.  What are the requirements to use WatchPAT one?  The be able to have a successful watchpat one sleep study, you should have your Watch pat one device, your smart phone, watch pat one app, your PIN number and Internet access What type of phone do I need?  You should have a smart phone that uses Android 5.1 and above or any Iphone with IOS 10 and above  How can I download the WatchPAT one app?  Based on your device type search for WatchPAT one app either in google play for android devices or APP store for Iphone's Where will I get my PIN for the study?  Your PIN will be provided by your physician's office. It is used for authentication and if you lose/forget your PIN, please reach out to your providers office.  I do not have Internet at  home. Can I do WatchPAT one study?  WatchPAT One needs Internet connection throughout the night to be able to transmit the sleep data. You can use your home/local internet or your cellular's data package. However, it is always recommended to use home/local Internet. It is estimated that between 20MB-30MB will be used with each study.However, the application will be looking for 80MB space in the phone to start the study.  What happens if I lose internet or bluetooth connection?  During the internet disconnection, your phone will not be able to transmit the sleep data. All the data, will be stored in your phone. As soon as the internet connection is back on, the phone will being sending the sleep data. During the bluetooth disconnection, WatchPAT one will not be able to to send the sleep data to your phone. Data will be kept in the [facility] one until two devices have bluetooth connection back on. As soon as the connection is back on, WatchPAT one will send the sleep data to the phone.  How long do I need to wear the WatchPAT one?  After you start the study, you should wear the device at least 6 hours.  How far should I keep my phone from the device?  During the night, your phone should be within 15 feet.  What happens if I leave the room for restroom or other reasons?  Leaving the room for any reason will not cause any problem. As soon as your get back to the room, both devices will reconnect and will continue to send the sleep data. Can I use my phone during the sleep study?  Yes, you can use your phone as usual during the study. But it is recommended to put your watchpat one on when you are ready to go to bed.  How will I get my study results?  A soon as you completed your study, your sleep data will be sent to the provider. They will then share the results with you when they are ready.    Follow-Up: Your physician recommends that you schedule a follow-up appointment in: 2 months  Any Other  Special Instructions Will Be Listed Below (If Applicable).  If you need a refill on your cardiac medications before your next appointment, please call your pharmacy.

## 2022-07-10 ENCOUNTER — Telehealth: Payer: Self-pay | Admitting: *Deleted

## 2022-07-10 NOTE — Telephone Encounter (Signed)
Prior Authorization for [facility] sent to [facility] via web portal. Tracking Number . do not require Pre-Authorization by [employer]

## 2022-07-11 ENCOUNTER — Telehealth: Payer: Self-pay | Admitting: Internal Medicine

## 2022-07-11 NOTE — Telephone Encounter (Signed)
Advised that per Mallipeddi at last visit, losartan restart should be discussed with PCP. Verbalized understanding.

## 2022-07-11 NOTE — Telephone Encounter (Signed)
Pt c/o medication issue:  1. Name of Medication:  losartan (COZAAR) 25 MG tablet  2. How are you currently taking this medication (dosage and times per day)?   3. Are you having a reaction (difficulty breathing--STAT)?   4. What is your medication issue?  Patient would like to know if she needs to continue taking this medication.

## 2022-08-05 ENCOUNTER — Encounter (INDEPENDENT_AMBULATORY_CARE_PROVIDER_SITE_OTHER): Payer: BC Managed Care – PPO | Admitting: Cardiology

## 2022-08-05 DIAGNOSIS — G4733 Obstructive sleep apnea (adult) (pediatric): Secondary | ICD-10-CM | POA: Diagnosis not present

## 2022-08-06 ENCOUNTER — Ambulatory Visit: Payer: BC Managed Care – PPO | Attending: Internal Medicine

## 2022-08-06 DIAGNOSIS — G4733 Obstructive sleep apnea (adult) (pediatric): Secondary | ICD-10-CM

## 2022-08-06 NOTE — Procedures (Signed)
SLEEP STUDY REPORT Patient Information Study Date: 08/05/2022 Patient Name: Leslie Hunter Patient ID: [id number] Birth Date: Jan 14, 1961 Age: 62 Gender: Female BMI: 43.9 (W=306 lb, H=5' 10'') Stopbang:  Referring Physician: Claudina Lick, MD  TEST DESCRIPTION: Home sleep apnea testing was completed using the WatchPat, a Type 1 device, utilizing peripheral arterial tonometry (PAT), chest movement, actigraphy, pulse oximetry, pulse rate, body position and snore.  AHI was calculated with apnea and hypopnea using valid sleep time as the denominator. RDI includes apneas, hypopneas, and RERAs.  The data acquired and the scoring of sleep and all associated events were performed in accordance with the recommended standards and specifications as outlined in the AASM Manual for the Scoring of Sleep and Associated Events 2.2.0 (2015).  FINDINGS:  1.  Severe Obstructive Sleep Apnea with AHI 48/hr.   2.  Mild Central Sleep Apnea with pAHIc 12.5/hr.  3.  Oxygen desaturations as low as 73%.  4.  Severe snoring was present. O2 sats were < 88% for 60.3 min.  5.  Total sleep time was 7 hrs and 16 min.  6.  20% of total sleep time was spent in REM sleep.   7.  Shortened sleep onset latency at 6 min.   8.  Prolonged REM sleep onset latency at 206 min.   9.  Total awakenings were 10.  10. Arrhythmia detection:  Suggestive of possible brief atrial fibrillation lasting 6 hours 20 min and 28 seconds.  This is not diagnostic and further testing with outpatient telemetry monitoring is recommended.  DIAGNOSIS:   Severe Obstructive Sleep Apnea (G47.33) Mild Central Sleep Apnea Nocturnal Hypoxemia Possible Atrial Fibrillation  RECOMMENDATIONS:   1.  Clinical correlation of these findings is necessary.  The decision to treat obstructive sleep apnea (OSA) is usually based on the presence of apnea symptoms or the presence of associated medical conditions such as Hypertension, Congestive Heart Failure, Atrial  Fibrillation or Obesity.  The most common symptoms of OSA are snoring, gasping for breath while sleeping, daytime sleepiness and fatigue.   2.  Initiating apnea therapy is recommended given the presence of symptoms and/or associated conditions. Recommend proceeding with one of the following:     a.  Auto-CPAP therapy with a pressure range of 5-20cm H2O.     b.  An oral appliance (OA) that can be obtained from certain dentists with expertise in sleep medicine.  These are primarily of use in non-obese patients with mild and moderate disease.     c.  An ENT consultation which may be useful to look for specific causes of obstruction and possible treatment options.     d.  If patient is intolerant to PAP therapy, consider referral to ENT for evaluation for hypoglossal nerve stimulator.   3.  Close follow-up is necessary to ensure success with CPAP or oral appliance therapy for maximum benefit.  4.  A follow-up oximetry study on CPAP is recommended to assess the adequacy of therapy and determine the need for supplemental oxygen or the potential need for Bi-level therapy.  An arterial blood gas to determine the adequacy of baseline ventilation and oxygenation should also be considered.  5.  Healthy sleep recommendations include:  adequate nightly sleep (normal 7-9 hrs/night), avoidance of caffeine after noon and alcohol near bedtime, and maintaining a sleep environment that is cool, dark and quiet.  6.  Weight loss for overweight patients is recommended.  Even modest amounts of weight loss can significantly improve the severity of sleep apnea.  7.  Snoring recommendations include:  weight loss where appropriate, side sleeping, and avoidance of alcohol before bed.  8.  Operation of motor vehicle should be avoided when sleepy.  Signature: Fransico Him, MD; [facility]; [place], [place] Board of Sleep Medicine Electronically Signed: 08/06/2022 Rev.

## 2022-08-11 NOTE — Patient Instructions (Signed)
Syndey Hunter  08/11/2022     @PREFPERIOPPHARMACY @   Your procedure is scheduled on  08/14/2022.   Report to [facility] at  1100  A.M.   Call this number if you have problems the morning of surgery:  [phone]  If you experience any cold or flu symptoms such as cough, fever, chills, shortness of breath, etc. between now and your scheduled surgery, please notify Leslie Hunter at the above number.   Remember:  Do not eat or drink after midnight.        DO NOT miss any doses of your eliquis before your procedure.      Use your inhaler before you come and bring your rescue inhaler with you.     Take these medicines the morning of surgery with A SIP OF WATER                                              cymbalta, synthroid.    Do not wear jewelry, make-up or nail polish.  Do not wear lotions, powders, or perfumes, or deodorant.  Do not shave 48 hours prior to surgery.  Men may shave face and neck.  Do not bring valuables to the hospital.  [facility] is not responsible for any belongings or valuables.  Contacts, dentures or bridgework may not be worn into surgery.  Leave your suitcase in the car.  After surgery it may be brought to your room.  For patients admitted to the hospital, discharge time will be determined by your treatment team.  Patients discharged the day of surgery will not be allowed to drive home and must have someone with them for 24 hours.    Special instructions:   DO NOT smoke tobacco or vape for 24 hours before your procedure.  Please read over the following fact sheets that you were given. Anesthesia Post-op Instructions and Care and Recovery After Surgery      Electrical Cardioversion Electrical cardioversion is the delivery of a jolt of electricity to restore a normal rhythm to the heart. A rhythm that is too fast or is not regular (arrhythmia) keeps the heart from pumping blood well. There is also another type of cardioversion called a chemical  (pharmacologic) cardioversion. This is when your health care provider gives you one or more medicines to bring back your regular heart rhythm. Electrical cardioversion is done as a scheduled procedure for arrhythmiasthat are not life-threatening. Electrical cardioversion may also be done in an emergency for sudden life-threatening arrhythmias. Tell a health care provider about: Any allergies you have. All medicines you are taking, including vitamins, herbs, eye drops, creams, and over-the-counter medicines. Any problems you or family members have had with sedatives or anesthesia. Any bleeding problems you have. Any surgeries you have had, including a pacemaker, defibrillator, or other implanted device. Any medical conditions you have. Whether you are pregnant or may be pregnant. What are the risks? Your provider will talk with you about risks. These include: Allergic reactions to medicines. Irritation to the skin on your chest or back where the sticky pads (electrodes) or paddles were put during electrical cardioversion. A blood clot that breaks free and travels to other parts of your body, such as your brain. Return of a worse abnormal heart rhythm that will need to be treated with medicines, a pacemaker, or an implantable  cardioverter defibrillator (ICD). What happens before the procedure? Medicines Your provider may give you: Blood-thinning medicines (anticoagulants) so your blood does not clot as easily. If your provider gives you this medicine, you may need to take it for 4 weeks before the procedure. Medicines to help stabilize your heart rate and rhythm. Ask your provider about: Changing or stopping your regular medicines. These include any diabetes medicines or blood thinners you take. Taking medicines such as aspirin and ibuprofen. These medicines can thin your blood. Do not take them unless your provider tells you to. Taking over-the-counter medicines, vitamins, herbs, and  supplements. General instructions Follow instructions from your provider about what you may eat and drink. Do not put any lotions, powders, or ointments on your chest and back for 24 hours before the procedure. They can cause problems with the electrodes or paddles used to deliver electricity to your heart. Do not wear jewelry as this can interfere with delivering electricity to your heart. If you will be going home right after the procedure, plan to have a responsible adult: Take you home from the hospital or clinic. You will not be allowed to drive. Care for you for the time you are told. Tests You may have an exam or testing. This may include: Blood labs. A transesophageal echocardiogram (TEE). What happens during the procedure?     An IV will be inserted into one of your veins. You will be given a sedative. This helps you relax. Electrodes or metal paddles will be placed on your chest. They may be placed in one of these ways: One placed on your right chest, the other on the left ribs. One placed on your chest and the other on your back. An electrical shock will be delivered. The shock briefly stops (resets) your heart rhythm. Your provider will check to see if your heart rhythm is now normal. Some people need only one shock. Some need more to restore a normal heart rhythm. The procedure may vary among providers and hospitals. What happens after the procedure? Your blood pressure, heart rate, breathing rate, and blood oxygen level will be monitored until you leave the hospital or clinic. Your heart rhythm will be watched to make sure it does not change. This information is not intended to replace advice given to you by your health care provider. Make sure you discuss any questions you have with your health care provider. Document Revised: 12/12/2021 Document Reviewed: 12/12/2021 Elsevier Patient Education  2023 [employer] Inc. Monitored Anesthesia Care, Care After The following  information offers guidance on how to care for yourself after your procedure. Your health care provider may also give you more specific instructions. If you have problems or questions, contact your health care provider. What can I expect after the procedure? After the procedure, it is common to have: Tiredness. Little or no memory about what happened during or after the procedure. Impaired judgment when it comes to making decisions. Nausea or vomiting. Some trouble with balance. Follow these instructions at home: For the time period you were told by your health care provider:  Rest. Do not participate in activities where you could fall or become injured. Do not drive or use machinery. Do not drink alcohol. Do not take sleeping pills or medicines that cause drowsiness. Do not make important decisions or sign legal documents. Do not take care of children on your own. Medicines Take over-the-counter and prescription medicines only as told by your health care provider. If you were prescribed antibiotics, take  them as told by your health care provider. Do not stop using the antibiotic even if you start to feel better. Eating and drinking Follow instructions from your health care provider about what you may eat and drink. Drink enough fluid to keep your urine pale yellow. If you vomit: Drink clear fluids slowly and in small amounts as you are able. Clear fluids include water, ice chips, low-calorie sports drinks, and fruit juice that has water added to it (diluted fruit juice). Eat light and bland foods in small amounts as you are able. These foods include bananas, applesauce, rice, lean meats, toast, and crackers. General instructions  Have a responsible adult stay with you for the time you are told. It is important to have someone help care for you until you are awake and alert. If you have sleep apnea, surgery and some medicines can increase your risk for breathing problems. Follow  instructions from your health care provider about wearing your sleep device: When you are sleeping. This includes during daytime naps. While taking prescription pain medicines, sleeping medicines, or medicines that make you drowsy. Do not use any products that contain nicotine or tobacco. These products include cigarettes, chewing tobacco, and vaping devices, such as e-cigarettes. If you need help quitting, ask your health care provider. Contact a health care provider if: You feel nauseous or vomit every time you eat or drink. You feel light-headed. You are still sleepy or having trouble with balance after 24 hours. You get a rash. You have a fever. You have redness or swelling around the IV site. Get help right away if: You have trouble breathing. You have new confusion after you get home. These symptoms may be an emergency. Get help right away. Call 911. Do not wait to see if the symptoms will go away. Do not drive yourself to the hospital. This information is not intended to replace advice given to you by your health care provider. Make sure you discuss any questions you have with your health care provider. Document Revised: 09/16/2021 Document Reviewed: 09/16/2021 Elsevier Patient Education  2023 [employer]

## 2022-08-12 ENCOUNTER — Encounter (HOSPITAL_COMMUNITY): Payer: Self-pay

## 2022-08-12 ENCOUNTER — Encounter (HOSPITAL_COMMUNITY)
Admission: RE | Admit: 2022-08-12 | Discharge: 2022-08-12 | Disposition: A | Discharge status: Home / Self Care | Payer: BC Managed Care – PPO | Source: Ambulatory Visit | Attending: Cardiology | Admitting: Cardiology

## 2022-08-12 VITALS — BP 135/86 | HR 99 | Temp 97.8°F | Resp 18 | Ht 70.0 in | Wt 306.9 lb

## 2022-08-12 DIAGNOSIS — I10 Essential (primary) hypertension: Secondary | ICD-10-CM | POA: Diagnosis not present

## 2022-08-12 DIAGNOSIS — F32A Depression, unspecified: Secondary | ICD-10-CM | POA: Diagnosis not present

## 2022-08-12 DIAGNOSIS — I4819 Other persistent atrial fibrillation: Secondary | ICD-10-CM | POA: Insufficient documentation

## 2022-08-12 DIAGNOSIS — Z01812 Encounter for preprocedural laboratory examination: Secondary | ICD-10-CM | POA: Insufficient documentation

## 2022-08-12 DIAGNOSIS — G4730 Sleep apnea, unspecified: Secondary | ICD-10-CM | POA: Diagnosis not present

## 2022-08-12 DIAGNOSIS — Z833 Family history of diabetes mellitus: Secondary | ICD-10-CM | POA: Diagnosis not present

## 2022-08-12 DIAGNOSIS — R5383 Other fatigue: Secondary | ICD-10-CM | POA: Diagnosis not present

## 2022-08-12 DIAGNOSIS — Z8249 Family history of ischemic heart disease and other diseases of the circulatory system: Secondary | ICD-10-CM | POA: Diagnosis not present

## 2022-08-12 DIAGNOSIS — R002 Palpitations: Secondary | ICD-10-CM | POA: Diagnosis not present

## 2022-08-12 DIAGNOSIS — E039 Hypothyroidism, unspecified: Secondary | ICD-10-CM | POA: Diagnosis not present

## 2022-08-12 DIAGNOSIS — Z8349 Family history of other endocrine, nutritional and metabolic diseases: Secondary | ICD-10-CM | POA: Diagnosis not present

## 2022-08-12 LAB — CBC WITH DIFFERENTIAL/PLATELET
Abs Immature Granulocytes: 0.06 10*3/uL (ref 0.00–0.07)
Basophils Absolute: 0.1 10*3/uL (ref 0.0–0.1)
Basophils Relative: 1 %
Eosinophils Absolute: 0.4 10*3/uL (ref 0.0–0.5)
Eosinophils Relative: 3 %
HCT: 44.5 % (ref 36.0–46.0)
Hemoglobin: 14.8 g/dL (ref 12.0–15.0)
Immature Granulocytes: 1 %
Lymphocytes Relative: 31 %
Lymphs Abs: 3.5 10*3/uL (ref 0.7–4.0)
MCH: 30.8 pg (ref 26.0–34.0)
MCHC: 33.3 g/dL (ref 30.0–36.0)
MCV: 92.7 fL (ref 80.0–100.0)
Monocytes Absolute: 0.7 10*3/uL (ref 0.1–1.0)
Monocytes Relative: 6 %
Neutro Abs: 6.6 10*3/uL (ref 1.7–7.7)
Neutrophils Relative %: 58 %
Platelets: 391 10*3/uL (ref 150–400)
RBC: 4.8 MIL/uL (ref 3.87–5.11)
RDW: 13.5 % (ref 11.5–15.5)
WBC: 11.4 10*3/uL — ABNORMAL HIGH (ref 4.0–10.5)
nRBC: 0 % (ref 0.0–0.2)

## 2022-08-12 LAB — BASIC METABOLIC PANEL
Anion gap: 7 (ref 5–15)
BUN: 16 mg/dL (ref 6–20)
CO2: 22 mmol/L (ref 22–32)
Calcium: 8.5 mg/dL — ABNORMAL LOW (ref 8.9–10.3)
Chloride: 104 mmol/L (ref 98–111)
Creatinine, Ser: 0.76 mg/dL (ref 0.44–1.00)
GFR, Estimated: >60 mL/min (ref >60)
Glucose, Bld: 142 mg/dL — ABNORMAL HIGH (ref 70–99)
Potassium: 4.2 mmol/L (ref 3.5–5.1)
Sodium: 133 mmol/L — ABNORMAL LOW (ref 135–145)

## 2022-08-12 LAB — PROTIME-INR
INR: 1.1 (ref 0.8–1.2)
Prothrombin Time: 13.9 seconds (ref 11.4–15.2)

## 2022-08-14 ENCOUNTER — Other Ambulatory Visit: Payer: Self-pay

## 2022-08-14 ENCOUNTER — Ambulatory Visit (HOSPITAL_COMMUNITY): Payer: BC Managed Care – PPO | Admitting: Anesthesiology

## 2022-08-14 ENCOUNTER — Encounter (HOSPITAL_COMMUNITY): Payer: Self-pay | Admitting: Cardiology

## 2022-08-14 ENCOUNTER — Ambulatory Visit (HOSPITAL_COMMUNITY)
Admission: RE | Admit: 2022-08-14 | Discharge: 2022-08-14 | Disposition: A | Discharge status: Home / Self Care | Payer: BC Managed Care – PPO | Attending: Cardiology | Admitting: Cardiology

## 2022-08-14 ENCOUNTER — Encounter (HOSPITAL_COMMUNITY)
Admission: RE | Disposition: A | Discharge status: Home / Self Care | Payer: Self-pay | Source: Home / Self Care | Attending: Cardiology

## 2022-08-14 DIAGNOSIS — I4819 Other persistent atrial fibrillation: Secondary | ICD-10-CM | POA: Diagnosis not present

## 2022-08-14 DIAGNOSIS — Z833 Family history of diabetes mellitus: Secondary | ICD-10-CM | POA: Insufficient documentation

## 2022-08-14 DIAGNOSIS — R002 Palpitations: Secondary | ICD-10-CM | POA: Insufficient documentation

## 2022-08-14 DIAGNOSIS — Z8249 Family history of ischemic heart disease and other diseases of the circulatory system: Secondary | ICD-10-CM | POA: Insufficient documentation

## 2022-08-14 DIAGNOSIS — I10 Essential (primary) hypertension: Secondary | ICD-10-CM | POA: Diagnosis not present

## 2022-08-14 DIAGNOSIS — F32A Depression, unspecified: Secondary | ICD-10-CM | POA: Diagnosis not present

## 2022-08-14 DIAGNOSIS — R5383 Other fatigue: Secondary | ICD-10-CM | POA: Insufficient documentation

## 2022-08-14 DIAGNOSIS — G4730 Sleep apnea, unspecified: Secondary | ICD-10-CM | POA: Insufficient documentation

## 2022-08-14 DIAGNOSIS — Z8349 Family history of other endocrine, nutritional and metabolic diseases: Secondary | ICD-10-CM | POA: Insufficient documentation

## 2022-08-14 DIAGNOSIS — E039 Hypothyroidism, unspecified: Secondary | ICD-10-CM | POA: Insufficient documentation

## 2022-08-14 HISTORY — PX: CARDIOVERSION: SHX1299

## 2022-08-14 SURGERY — CARDIOVERSION
Anesthesia: General

## 2022-08-14 MED ORDER — AMIODARONE HCL 200 MG PO TABS: ORAL_TABLET | ORAL | 0 refills | Status: DC

## 2022-08-14 MED ORDER — SODIUM CHLORIDE 0.9 % IV SOLN: INTRAVENOUS | Status: DC

## 2022-08-14 MED ORDER — PROPOFOL 10 MG/ML IV BOLUS
INTRAVENOUS | Status: DC | PRN
  Administered 2022-08-14: 100 mg via INTRAVENOUS

## 2022-08-14 MED ORDER — CHLORHEXIDINE GLUCONATE 0.12 % MT SOLN: 15.0000 mL | Freq: Once | OROMUCOSAL | Status: DC

## 2022-08-14 MED ORDER — LIDOCAINE HCL (CARDIAC) PF 100 MG/5ML IV SOSY
PREFILLED_SYRINGE | INTRAVENOUS | Status: DC | PRN
  Administered 2022-08-14: 100 mg via INTRAVENOUS

## 2022-08-14 MED ORDER — ORAL CARE MOUTH RINSE: 15.0000 mL | Freq: Once | OROMUCOSAL | Status: DC

## 2022-08-14 MED ORDER — LACTATED RINGERS IV SOLN: INTRAVENOUS | Status: DC | PRN

## 2022-08-14 MED ORDER — LACTATED RINGERS IV SOLN: INTRAVENOUS | Status: DC

## 2022-08-14 NOTE — Transfer of Care (Signed)
Immediate Anesthesia Transfer of Care Note  Patient: Leslie Hunter  Procedure(s) Performed: CARDIOVERSION  Patient Location: PACU  Anesthesia Type:General  Level of Consciousness: drowsy and patient cooperative  Airway & Oxygen Therapy: Patient Spontanous Breathing and Patient connected to nasal cannula oxygen  Post-op Assessment: Report given to RN, Post -op Vital signs reviewed and stable, and Patient moving all extremities X 4  Post vital signs: Reviewed and stable  Last Vitals:  Vitals Value Taken Time  BP 105/71   Temp    Pulse 68   Resp 12   SpO2 94     Last Pain:  Vitals:   08/14/22 1142  TempSrc: Oral  PainSc:       Patients Stated Pain Goal: 5 (08/14/22 1140)  Complications: No notable events documented.

## 2022-08-14 NOTE — CV Procedure (Signed)
CV Procedure Note  Procedure: Electrical cardioversion Indication: persistent afib Physician: Dina Rich    Patient brought to the procedure suite after appropriate consent was obtained. Defib pads placed in the anterior and posterior positions. I confirmed with patient she had not missed any doses of eliquis within 3 weeks. Sedation achieved with the assistance of anesthesiology, for details please refer to there documentation. She was succesfully cardioverted to normal sinus rhythm with a single synchronized 200 j shock. Cardiopulmonary monitoring was performed throughout the procedure, she tolerated well without complications   Dr Jenene Slicker has planned for amiodarone post conversion. Will start 200mg  bid x 3 weeks then 200mg  daily as requested.   Dina Rich MD

## 2022-08-14 NOTE — Anesthesia Preprocedure Evaluation (Signed)
Anesthesia Evaluation  Patient identified by MRN, date of birth, ID band Patient awake    Reviewed: Allergy & Precautions, H&P , NPO status , Patient's Chart, lab work & pertinent test results, reviewed documented beta blocker date and time   Airway Mallampati: II  TM Distance: >3 FB Neck ROM: full    Dental no notable dental hx.    Pulmonary neg pulmonary ROS, sleep apnea    Pulmonary exam normal breath sounds clear to auscultation       Cardiovascular Exercise Tolerance: Good hypertension, + dysrhythmias Atrial Fibrillation  Rhythm:irregular Rate:Normal     Neuro/Psych  PSYCHIATRIC DISORDERS  Depression    negative neurological ROS  negative psych ROS   GI/Hepatic negative GI ROS, Neg liver ROS,,,  Endo/Other  negative endocrine ROSHypothyroidism    Renal/GU negative Renal ROS  negative genitourinary   Musculoskeletal   Abdominal   Peds  Hematology negative hematology ROS (+)   Anesthesia Other Findings   Reproductive/Obstetrics negative OB ROS                             Anesthesia Physical Anesthesia Plan  ASA: 3  Anesthesia Plan: General   Post-op Pain Management:    Induction:   PONV Risk Score and Plan: Propofol infusion  Airway Management Planned:   Additional Equipment:   Intra-op Plan:   Post-operative Plan:   Informed Consent: I have reviewed the patients History and Physical, chart, labs and discussed the procedure including the risks, benefits and alternatives for the proposed anesthesia with the patient or authorized representative who has indicated his/her understanding and acceptance.     Dental Advisory Given  Plan Discussed with: CRNA  Anesthesia Plan Comments:        Anesthesia Quick Evaluation

## 2022-08-14 NOTE — H&P (Signed)
Procedure H&P  Patient presents for outpatient electrical cardioversion for persistent afib as requested by her primary cardiologist Dr Jenene Slicker. She is on eliquis for anticoagulation. Plan for cardioversion today with the assistance of anesthesiology. For full medical history please refer to referenced clinic note below  Dr Jenene Slicker has requested amiodarone poster conversion 200mg  bid x 3 weeks then 200mg  daily.   Dina Rich MD    Cardiology Office Note   Date: 07/09/2022    ID: [name], DOB 1960/09/26, MRN [id number]   PCP:  Reather Littler, MD           Cardiologist:  None Electrophysiologist:  None    Reason for Office Visit: Evaluation of persistent atrial fibrillation the request of Dr. Olena Leatherwood     History of Present Illness: Leslie Hunter is a 62 y.o. female known to have persistent A-fib (newly diagnosed in 04/2022), HTN, hypothyroidism was referred to cardiology clinic for evaluation of persistent atrial fibrillation.   Patient had recent ER visit in 04/2022 for palpitations and chest pain. EKG showed new onset atrial fibrillation, rate controlled. She was discharged on Eliquis 5 mg twice daily and she was already on nebivolol. She is off Eliquis for the last 2 weeks as she did not have any refills. Otherwise, she has palpitations here and there but not frequently. But she has been having fatigue for the last 6 months.  Denies DOE, dizziness, lightness, syncope.  Denies any angina.  Denies smoking cigarettes.  Her mother passed away when she was 62 years old and her aunt raised her.  Her aunt has atrial fibrillation and CHF.       Past Medical History:  Diagnosis Date   Depression     Hypothyroidism             Past Surgical History:  Procedure Laterality Date   COLONOSCOPY N/A 11/23/2013    Procedure: COLONOSCOPY;  Surgeon: Corbin Ade, MD;  Location: [facility];  Service: Endoscopy;  Laterality: N/A;  1:45 PM   SPLENECTOMY N/A      ITP   TONSILLECTOMY    1984            Current Outpatient Medications  Medication Sig Dispense Refill   DULoxetine (CYMBALTA) 30 MG capsule Take 30 mg by mouth daily.       levothyroxine (SYNTHROID, LEVOTHROID) 125 MCG tablet Take 125 mcg by mouth daily before breakfast.       peg 3350 powder (MOVIPREP) 100 G SOLR Take 1 kit (200 g total) by mouth as directed. 1 kit 0    No current facility-administered medications for this visit.    Allergies:  Other and Compazine [prochlorperazine edisylate]    Social History: The patient  reports that she has never smoked. She has never used smokeless tobacco. She reports that she does not drink alcohol and does not use drugs.    Family History: The patient's family history includes Cancer in her mother; Diabetes in her father; Hypertension in her father; Thyroid disease in her paternal aunt.    ROS:  Please see the history of present illness. Otherwise, complete review of systems is positive for none.  All other systems are reviewed and negative.    Physical Exam: VS:  Ht 5\' 10"  (1.778 m)   Wt (!) 306 lb 12.8 oz (139.2 kg)   LMP 10/03/2012   BMI 44.02 kg/m , BMI Body mass index is 44.02 kg/m.      Wt Readings from Last 3 Encounters:  07/09/22 (!) 306 lb 12.8 oz (139.2 kg)  11/23/13 260 lb (117.9 kg)  11/22/12 280 lb 14.4 oz (127.4 kg)    General: Patient appears comfortable at rest. HEENT: Conjunctiva and lids normal, oropharynx clear with moist mucosa. Neck: Supple, no elevated JVP or carotid bruits, no thyromegaly. Lungs: Clear to auscultation, nonlabored breathing at rest. Cardiac: Regular rate and rhythm, no S3 or significant systolic murmur, no pericardial rub. Abdomen: Soft, nontender, no hepatomegaly, bowel sounds present, no guarding or rebound. Extremities: No pitting edema, distal pulses 2+. Skin: Warm and dry. Musculoskeletal: No kyphosis. Neuropsychiatric: Alert and oriented x3, affect grossly appropriate.   ECG:  An ECG dated 07/09/2022 was  personally reviewed today and demonstrated:  Atrial fibrillation, rate controlled   Recent Labwork: No results found for requested labs within last 365 days.  Labs (Brief)  No results found for: "CHOL", "TRIG", "HDL", "CHOLHDL", "VLDL", "LDLCALC", "LDLDIRECT"     Other Studies Reviewed Today:     Assessment and Plan:   Patient is a 62 year old F known to have persistent atrial fibrillation (newly diagnosed in 12/23), HTN, hypothyroidism was referred to cardiology clinic for evaluation of persistent A-fib.   # Persistent A-fib -Patient was newly diagnosed with rate controlled atrial fibrillation in 04/2022. She has been having fatigue for the last 6 months. EKG from 04/2022 and today in the clinic showed atrial fibrillation. On and off palpitations. Patient will benefit from rhythm control strategy, DCCV.  She is currently off Eliquis for the last 2 weeks. Will resume Eliquis 5 mg twice daily and scheduled for DCCV after 5 weeks. Patient is instructed not to miss any doses. She voiced understanding. After DCCV, amiodarone regimen will be started (amiodarone 200 mg twice daily for 3 weeks followed by amiodarone 200 mg once daily to maintain NSR). -Continue nebivolol 20 mg once daily -Resume Eliquis 5 mg twice daily (co-pay $47) -Treat underlying risk factors, STOP-BANG score is 6. She will benefit from sleep apnea testing.  Currently treated for hypothyroidism, obtain TSH.  (Can be done with PCP) Informed consent for DCCV The risks, benefits and alternatives for the DCCV procedure were discussed and the patient comprehended these risks. Risks include, but are not limited to, chest pain, aspiration, unsuccessful cardioversion, cardiac arrest and possibility of PPM implantation.   # HTN, controlled -Continue nebivolol 20 mg once daily.  Patient not taking losartan anymore.   # OSA screening -Due to new onset atrial fibrillation and STOP-BANG score 6, she will benefit from sleep apnea testing,  patient preferred home sleep study.  If negative, she understands that she will need to undergo gold standard testing of lab sleep study.   I have spent a total of 45 minutes with patient reviewing chart, EKGs, labs and examining patient as well as establishing an assessment and plan that was discussed with the patient.  > 50% of time was spent in direct patient care.       Medication Adjustments/Labs and Tests Ordered: Current medicines are reviewed at length with the patient today.  Concerns regarding medicines are outlined above.    Tests Ordered:    Orders Placed This Encounter  Procedures   EKG 12-Lead      Medication Changes: No orders of the defined types were placed in this encounter.     Disposition:  Follow up  2 months   Signed Vishnu Verne Spurr, MD, 07/09/2022 8:05 AM    [facility] [address] [place], [place], [place]  [address] 

## 2022-08-15 NOTE — Progress Notes (Signed)
Electrical Cardioversion Procedure Note Leslie Hunter [id number] 05-20-1960  Procedure: Electrical Cardioversion Indications:  Atrial Fibrillation  Procedure Details Consent: Risks of procedure as well as the alternatives and risks of each were explained to the (patient/caregiver).  Consent for procedure obtained. Time Out: Verified patient identification, verified procedure, site/side was marked, verified correct patient position, special equipment/implants available, medications/allergies/relevent history reviewed, required imaging and test results available.  Performed  Patient placed on cardiac monitor, pulse oximetry, supplemental oxygen as necessary.  Sedation given: Benzodiazepines Pacer pads placed anterior and posterior chest.  Cardioverted 1 time(s).  Cardioverted at 200J.  Evaluation Findings: Post procedure EKG shows: NSR Complications: None Patient did tolerate procedure well.   Ramiro Harvest 08/15/2022, 10:16 AM

## 2022-08-15 NOTE — Anesthesia Postprocedure Evaluation (Signed)
Anesthesia Post Note  Patient: Leslie Hunter  Procedure(s) Performed: CARDIOVERSION  Patient location during evaluation: Phase II Anesthesia Type: General Level of consciousness: awake Pain management: pain level controlled Vital Signs Assessment: post-procedure vital signs reviewed and stable Respiratory status: spontaneous breathing and respiratory function stable Cardiovascular status: blood pressure returned to baseline and stable Postop Assessment: no headache and no apparent nausea or vomiting Anesthetic complications: no Comments: Late entry   No notable events documented.   Last Vitals:  Vitals:   08/14/22 1345 08/14/22 1400  BP: (!) 114/55 125/66  Pulse: (!) 54 (!) 57  Resp: 19 19  Temp:    SpO2: 96% 96%    Last Pain:  Vitals:   08/15/22 1348  TempSrc:   PainSc: 0-No pain                 Windell Norfolk

## 2022-08-18 ENCOUNTER — Encounter: Payer: Self-pay | Admitting: Internal Medicine

## 2022-08-18 ENCOUNTER — Telehealth: Payer: Self-pay | Admitting: Internal Medicine

## 2022-08-18 ENCOUNTER — Ambulatory Visit: Payer: BC Managed Care – PPO | Attending: Internal Medicine | Admitting: Internal Medicine

## 2022-08-18 VITALS — BP 118/72 | HR 58 | Ht 66.0 in | Wt 307.0 lb

## 2022-08-18 DIAGNOSIS — I480 Paroxysmal atrial fibrillation: Secondary | ICD-10-CM | POA: Diagnosis not present

## 2022-08-18 DIAGNOSIS — E039 Hypothyroidism, unspecified: Secondary | ICD-10-CM | POA: Diagnosis not present

## 2022-08-18 DIAGNOSIS — R5383 Other fatigue: Secondary | ICD-10-CM | POA: Insufficient documentation

## 2022-08-18 NOTE — Telephone Encounter (Signed)
  Pt c/o Shortness Of Breath: STAT if SOB developed within the last 24 hours or pt is noticeably SOB on the phone  1. Are you currently SOB (can you hear that pt is SOB on the phone)? No   2. How long have you been experiencing SOB? Started yesterday   3. Are you SOB when sitting or when up moving around? When walking and laying down  4. Are you currently experiencing any other symptoms? She feel her heart is fluttering again.   Pt said, she recently had cardioversion last Thursday and yesterday she started to feel weak, she gets SOB when she walks or laying down but when sitting up she feels ok. She also feels like she is back in afib and can feel her heart fluttering again

## 2022-08-18 NOTE — Progress Notes (Signed)
Cardiology Office Note  Date: 08/18/2022   ID: [name], DOB 1960-08-28, MRN [id number]  PCP:  Toma Deiters, MD  Cardiologist:  Marjo Bicker, MD Electrophysiologist:  None   Reason for Office Visit: Follow-up post DCCV   History of Present Illness: Leslie Hunter is a 62 y.o. female known to have persistent A-fib (newly diagnosed in 04/2022) s/p DCCV in 08/14/2022, HTN, hypothyroidism is here for follow-up visit.  Patient had recent ER visit in 04/2022 for palpitations and chest pain. EKG showed new onset atrial fibrillation, rate controlled.  Due to intermittent palpitations and fatigue x 6 months, she was scheduled for DCCV and successfully converted to NSR on 08/14/2022.  She was started on amiodarone regimen after the procedure. Her fatigue completely resolved for couple of days after DCCV but returned back again in the last 2 days with severe sluggishness.  I instructed her to stop amiodarone and obtain TSH levels.  Otherwise denies palpitations, chest pain, SOB.    Her mother passed away when she was 55 years old and her aunt raised her.  Her aunt has atrial fibrillation and CHF.  Past Medical History:  Diagnosis Date   Depression    HTN, goal below 130/80 07/09/2022   Hypothyroidism     Past Surgical History:  Procedure Laterality Date   COLONOSCOPY N/A 11/23/2013   Procedure: COLONOSCOPY;  Surgeon: Corbin Ade, MD;  [facility];  Service: Endoscopy;  Laterality: N/A;  1:45 PM   SPLENECTOMY N/A    ITP   TONSILLECTOMY  1984    Current Outpatient Medications  Medication Sig Dispense Refill   DULoxetine (CYMBALTA) 60 MG capsule Take 60 mg by mouth daily.     ELIQUIS 5 MG TABS tablet Take 1 tablet (5 mg total) by mouth 2 (two) times daily. 60 tablet 6   losartan (COZAAR) 25 MG tablet Take 25 mg by mouth daily.     Nebivolol HCl 20 MG TABS Take 20 mg by mouth daily.     SYNTHROID 150 MCG tablet Take 150 mcg by mouth daily before breakfast.      TRELEGY ELLIPTA 100-62.5-25 MCG/ACT AEPB Inhale 1 puff into the lungs daily as needed (shortness of breath).     No current facility-administered medications for this visit.   Allergies:  Other and Compazine [prochlorperazine edisylate]   Social History: The patient  reports that she has never smoked. She has never used smokeless tobacco. She reports that she does not drink alcohol and does not use drugs.   Family History: The patient's family history includes Cancer in her mother; Diabetes in her father; Hypertension in her father; Thyroid disease in her paternal aunt.   ROS:  Please see the history of present illness. Otherwise, complete review of systems is positive for none.  All other systems are reviewed and negative.   Physical Exam: VS:  BP 118/72   Pulse (!) 58   Ht  (1.676 m)   Wt (!) 307 lb (139.3 kg)   LMP 10/03/2012   SpO2 94%   BMI 49.55 kg/m , BMI Body mass index is 49.55 kg/m.  Wt Readings from Last 3 Encounters:  08/18/22 (!) 307 lb (139.3 kg)  08/14/22 (!) 306 lb 7 oz (139 kg)  08/12/22 (!) 306 lb 14.1 oz (139.2 kg)    General: Patient appears comfortable at rest. HEENT: Conjunctiva and lids normal, oropharynx clear with moist mucosa. Neck: Supple, no elevated JVP or carotid bruits, no thyromegaly.  Lungs: Clear to auscultation, nonlabored breathing at rest. Cardiac: Regular rate and rhythm, no S3 or significant systolic murmur, no pericardial rub. Abdomen: Soft, nontender, no hepatomegaly, bowel sounds present, no guarding or rebound. Extremities: No pitting edema, distal pulses 2+. Skin: Warm and dry. Musculoskeletal: No kyphosis. Neuropsychiatric: Alert and oriented x3, affect grossly appropriate.  ECG: Post DCCV EKG and today's EKG showed normal sinus rhythm  Recent Labwork: 08/12/2022: BUN 16; Creatinine, Ser 0.76; Hemoglobin 14.8; Platelets 391; Potassium 4.2; Sodium 133  No results found for: "CHOL", "TRIG", "HDL", "CHOLHDL", "VLDL", "LDLCALC",  "LDLDIRECT"  Other Studies Reviewed Today: Echocardiogram in 04/2022 LVEF normal Mild MR LA moderately dilated RA mild to moderately dilated  Assessment and Plan:  Patient is a 62 year old F known to have persistent atrial fibrillation (newly diagnosed in 12/23) s/p DCCV on 08/14/2022, HTN, hypothyroidism is here for post DCCV follow-up visit.  # Persistent A-fib s/p DCCV on 08/14/2022 -Post DCCV EKG showed NSR and today's EKG also showed NSR. Patient's symptoms of fatigue resolved for couple of days after DCCV but developed severe fatigue in the last 2 days.  I do not think she is on amiodarone regimen long enough to cause any hypothyroidism symptoms. Will stop amiodarone and obtain TSH. Continue nebivolol 20 mg once daily and continue Eliquis 5 mg twice daily (co-pay $ 47). -Patient was diagnosed with OSA recently with a home sleep study, awaiting CPAP titration and sleep medicine lab.  # HTN, controlled -Continue nebivolol 20 mg once daily, not taking losartan anymore.  # OSA -Due to new onset atrial fibrillation and STOP-BANG score 6, patient was referred to home sleep study where she was diagnosed with sleep apnea. She was awaiting CPAP titration from the sleep medicine lab.  # Fatigue -Patient has fatigue x 6 months which was thought to be from A-fib and she underwent DCCV procedure on 08/14/2022 with successful conversion to NSR.  Her fatigue completely resolved after DCCV but returned back again in the last 2 days.  I do not think she is on amiodarone regimen long enough to cause any hypothyroidism symptoms.  Regardless, will stop amiodarone and will obtain TSH and iron panel. Instructed patient to check vitamin D levels with her PCP.  I have spent a total of 30 minutes with patient reviewing chart, EKGs, labs and examining patient as well as establishing an assessment and plan that was discussed with the patient.  > 50% of time was spent in direct patient care.     Medication  Adjustments/Labs and Tests Ordered: Current medicines are reviewed at length with the patient today.  Concerns regarding medicines are outlined above.   Tests Ordered: Orders Placed This Encounter  Procedures   TSH   Iron, TIBC and Ferritin Panel   EKG 12-Lead    Medication Changes: No orders of the defined types were placed in this encounter.   Disposition:  Follow up 6 months  Signed Darrik Richman Verne Spurr, MD, 08/18/2022 11:56 AM    [facility] at [facility] [address] [place], [place], [place] [address]

## 2022-08-18 NOTE — Patient Instructions (Addendum)
Medication Instructions:  Your physician has recommended you make the following change in your medication:  Stop amiodarone Continue other medications the same  Labwork: TSH & Iron Panel today at [facility] Non-fasting  Testing/Procedures: none  Follow-Up: Your physician recommends that you schedule a follow-up appointment in: 6 months  Any Other Special Instructions Will Be Listed Below (If Applicable).  If you need a refill on your cardiac medications before your next appointment, please call your pharmacy.

## 2022-08-18 NOTE — Telephone Encounter (Signed)
Contacted and gave appointment to be seen today at 9:40 am with Dr. Jenene Slicker.

## 2022-08-20 ENCOUNTER — Telehealth: Payer: Self-pay | Admitting: Internal Medicine

## 2022-08-20 NOTE — Telephone Encounter (Signed)
Patient called and stated that she needs some results sent in Dr. Olena Leatherwood

## 2022-08-21 ENCOUNTER — Encounter (HOSPITAL_COMMUNITY): Payer: Self-pay | Admitting: Cardiology

## 2022-08-21 NOTE — Telephone Encounter (Signed)
Lab results sent to Dr. Olena Leatherwood, per patient request.

## 2022-09-08 ENCOUNTER — Ambulatory Visit: Payer: BC Managed Care – PPO | Admitting: Internal Medicine

## 2022-09-18 ENCOUNTER — Telehealth: Payer: Self-pay | Admitting: *Deleted

## 2022-09-18 NOTE — Telephone Encounter (Signed)
The patient has been notified of the result. Left detailed message on voicemail and informed patient to call back..Keileigh Vahey Green, CMA   

## 2022-09-18 NOTE — Telephone Encounter (Signed)
-----   Message from Gaynelle Cage, CMA sent at 08/07/2022  8:47 AM EDT -----  ----- Message ----- From: Quintella Reichert, MD Sent: 08/06/2022   3:38 PM EDT To: Cv Div Sleep Studies  Please let patient know that they have sleep apnea.  Recommend therapeutic CPAP titration for treatment of patient's sleep disordered breathing.  If unable to perform an in lab titration then initiate ResMed auto CPAP from 4 to 15cm H2O with heated humidity and mask of choice and overnight pulse ox on CPAP.

## 2022-09-19 ENCOUNTER — Other Ambulatory Visit: Payer: Self-pay | Admitting: *Deleted

## 2022-09-19 DIAGNOSIS — I480 Paroxysmal atrial fibrillation: Secondary | ICD-10-CM

## 2022-09-19 MED ORDER — ELIQUIS 5 MG PO TABS
5.0000 mg | ORAL_TABLET | Freq: Two times a day (BID) | ORAL | 5 refills | Status: DC
Start: 2022-09-19 — End: 2023-05-07

## 2022-09-19 NOTE — Telephone Encounter (Signed)
Prescription refill request for Eliquis received. Indication: Afib  Last office visit:08/18/22 (Mallipeddi)  Scr: 0.76 (08/12/22)  Age: 62 Weight: 139.3kg  Appropriate dose. Refill sent.

## 2022-09-26 NOTE — Telephone Encounter (Signed)
The patient has been notified of the result. Left detailed message on voicemail and informed patient to call back..Karlin Heilman Green, CMA   

## 2022-10-07 NOTE — Telephone Encounter (Signed)
The patient has been notified of the result. Left detailed message on voicemail and informed patient to call back..Adasha Boehme Green, CMA   

## 2023-02-16 ENCOUNTER — Encounter: Payer: Self-pay | Admitting: Internal Medicine

## 2023-02-16 ENCOUNTER — Ambulatory Visit: Payer: BC Managed Care – PPO | Attending: Internal Medicine | Admitting: Internal Medicine

## 2023-02-16 ENCOUNTER — Telehealth: Payer: Self-pay | Admitting: Internal Medicine

## 2023-02-16 VITALS — BP 118/78 | HR 98 | Ht 70.0 in | Wt 321.2 lb

## 2023-02-16 DIAGNOSIS — R5383 Other fatigue: Secondary | ICD-10-CM

## 2023-02-16 DIAGNOSIS — I480 Paroxysmal atrial fibrillation: Secondary | ICD-10-CM

## 2023-02-16 DIAGNOSIS — Z7901 Long term (current) use of anticoagulants: Secondary | ICD-10-CM | POA: Diagnosis not present

## 2023-02-16 DIAGNOSIS — G4733 Obstructive sleep apnea (adult) (pediatric): Secondary | ICD-10-CM

## 2023-02-16 MED ORDER — DILTIAZEM HCL 60 MG PO TABS: 60.0000 mg | ORAL_TABLET | Freq: Three times a day (TID) | ORAL | 2 refills | Status: DC

## 2023-02-16 MED ORDER — DILTIAZEM HCL ER 60 MG PO CP12: 60.0000 mg | ORAL_CAPSULE | Freq: Three times a day (TID) | ORAL | 3 refills | Status: DC

## 2023-02-16 MED ORDER — METOPROLOL TARTRATE 25 MG PO TABS: 25.0000 mg | ORAL_TABLET | Freq: Two times a day (BID) | ORAL | 2 refills | Status: DC

## 2023-02-16 NOTE — Telephone Encounter (Signed)
Pt c/o medication issue:  1. Name of Medication:   diltiazem (CARDIZEM SR) 60 MG 12 hr capsule   2. How are you currently taking this medication (dosage and times per day)?   3. Are you having a reaction (difficulty breathing--STAT)?   4. What is your medication issue?   Caller (Cassie) wants a call back to clarify the dosage for this medication.

## 2023-02-16 NOTE — Telephone Encounter (Signed)
Updated script sent to pharmacy

## 2023-02-16 NOTE — Patient Instructions (Addendum)
Medication Instructions:  Your physician has recommended you make the following change in your medication:  Stop taking Nebivolol  Start taking Diltiazem 60 mg every 8 hours  Start taking Metoprolol tartrate 25 mg twice daily Continue taking all other medications as prescribed  Labwork: TSH at [facility]  Testing/Procedures: None  Follow-Up: Your physician recommends that you schedule a follow-up appointment in: Nurse visit in 1 week and follow up in one month with Dr. Jenene Slicker  Any Other Special Instructions Will Be Listed Below (If Applicable). Follow up with Dr. Norris Cross office at [phone] for CPAP titration   Thank you for choosing [facility]!      If you need a refill on your cardiac medications before your next appointment, please call your pharmacy.

## 2023-02-16 NOTE — Addendum Note (Signed)
Addended by: Carmelina Paddock on: 02/16/2023 02:59 PM   Modules accepted: Orders

## 2023-02-16 NOTE — Progress Notes (Signed)
Cardiology Office Note  Date: 02/16/2023   ID: Leslie Hunter, DOB 1960/06/21, MRN [id number]  PCP:  Toma Deiters, MD  Cardiologist:  Marjo Bicker, MD Electrophysiologist:  None    History of Present Illness: Leslie Hunter is a 62 y.o. female known to have persistent A-fib (newly diagnosed in 04/2022) s/p DCCV in 08/14/2022, HTN, hypothyroidism is here for follow-up visit.  Patient had recent ER visit in 04/2022 for palpitations and chest pain. EKG showed new onset atrial fibrillation, rate controlled.  Due to intermittent palpitations and fatigue x 6 months, she was scheduled for DCCV and successfully converted to NSR on 08/14/2022.  She was started on amiodarone regimen after the procedure. Her fatigue completely resolved for couple of days after DCCV but returned back again in the last 2 days with severe sluggishness due to which amiodarone was discontinued.  Patient also reported that it was making her feel sick. She is on levothyroxine 200 mcg once daily (diagnosed with hypothyroidism since she was a kid).  She is here for follow-up visit.  Started feeling extremely tired for the last 3 weeks, EKG today showed atrial fibrillation with RVR, HR 107 bpm.  EKG from 4/24 showed normal sinus rhythm.  Last known normal sinus rhythm EKG was from 4/24.  She does have palpitations when she exerts.  No syncope, angina, DOE.  No orthopnea, PND.  Patient stepdaughter passed away in 23-Jun-2024her mother-in-law passed away the last 1 year.  Her mother passed away when she was 62 years old and her aunt raised her.  Her aunt has atrial fibrillation and CHF.  Past Medical History:  Diagnosis Date   Depression    HTN, goal below 130/80 07/09/2022   Hypothyroidism     Past Surgical History:  Procedure Laterality Date   CARDIOVERSION N/A 08/14/2022   Procedure: CARDIOVERSION;  Surgeon: Antoine Poche, MD;  Location: [facility];  Service: Endoscopy;  Laterality: N/A;   COLONOSCOPY N/A 11/23/2013    Procedure: COLONOSCOPY;  Surgeon: Corbin Ade, MD;  [facility];  Service: Endoscopy;  Laterality: N/A;  1:45 PM   SPLENECTOMY N/A    ITP   TONSILLECTOMY  1984    Current Outpatient Medications  Medication Sig Dispense Refill   DULoxetine (CYMBALTA) 60 MG capsule Take 60 mg by mouth daily.     ELIQUIS 5 MG TABS tablet Take 1 tablet (5 mg total) by mouth 2 (two) times daily. 60 tablet 5   losartan (COZAAR) 25 MG tablet Take 25 mg by mouth daily.     Nebivolol HCl 20 MG TABS Take 20 mg by mouth daily.     SYNTHROID 150 MCG tablet Take 150 mcg by mouth daily before breakfast.     TRELEGY ELLIPTA 100-62.5-25 MCG/ACT AEPB Inhale 1 puff into the lungs daily as needed (shortness of breath).     No current facility-administered medications for this visit.   Allergies:  Other and Compazine [prochlorperazine edisylate]   Social History: The patient  reports that she has never smoked. She has never used smokeless tobacco. She reports that she does not drink alcohol and does not use drugs.   Family History: The patient's family history includes Cancer in her mother; Diabetes in her father; Hypertension in her father; Thyroid disease in her paternal aunt.   ROS:  Please see the history of present illness. Otherwise, complete review of systems is positive for none.  All other systems are reviewed and negative.  Physical Exam: VS:  LMP 10/03/2012 , BMI There is no height or weight on file to calculate BMI.  Wt Readings from Last 3 Encounters:  08/18/22 (!) 307 lb (139.3 kg)  08/14/22 (!) 306 lb 7 oz (139 kg)  08/12/22 (!) 306 lb 14.1 oz (139.2 kg)    General: Patient appears comfortable at rest. HEENT: Conjunctiva and lids normal, oropharynx clear with moist mucosa. Neck: Supple, no elevated JVP or carotid bruits, no thyromegaly. Lungs: Clear to auscultation, nonlabored breathing at rest. Cardiac: Regular rate and rhythm, no S3 or significant systolic murmur, no pericardial  rub. Abdomen: Soft, nontender, no hepatomegaly, bowel sounds present, no guarding or rebound. Extremities: No pitting edema, distal pulses 2+. Skin: Warm and dry. Musculoskeletal: No kyphosis. Neuropsychiatric: Alert and oriented x3, affect grossly appropriate.  ECG: Post DCCV EKG and today's EKG showed normal sinus rhythm  Recent Labwork: 08/12/2022: BUN 16; Creatinine, Ser 0.76; Hemoglobin 14.8; Platelets 391; Potassium 4.2; Sodium 133  No results found for: "CHOL", "TRIG", "HDL", "CHOLHDL", "VLDL", "LDLCALC", "LDLDIRECT"  Other Studies Reviewed Today: Echocardiogram in 04/2022 LVEF normal Mild MR LA moderately dilated RA mild to moderately dilated  Assessment and Plan:  Patient is a 62 year old F known to have persistent atrial fibrillation (newly diagnosed in 12/23) s/p DCCV on 08/14/2022, HTN, hypothyroidism is here for post DCCV follow-up visit.\  1.  Paroxysmal A-fib s/p DCCV in 08/14/2022 -EKG today showed A-fib with RVR, HR 107 bpm, last known normal EKG with NSR was from 08/2022. Reported feeling extremely tired/exhausted for the last 3 weeks, start diltiazem 60 mg every 8 hours and metoprolol tartrate 25 mg twice daily.  Will bring the patient back in 1 week for nurse visit EKG. Obtain TSH.  Did not tolerate amiodarone in the past (made her feel sick).  Continue Eliquis 5 mg twice daily. Diagnosed with OSA, awaiting CPAP titration.  2.  HTN, controlled -Continue above medications, continue losartan 25 mg once daily.  3.  OSA, awaiting CPAP titration: Will provide patient with Dr. Gloris Manchester Turner's office number.    I have spent a total duration of 30 minutes reviewing the labs, prior notes, face-to-face counseling discussing her medical condition and management, documenting the findings in the note.    Disposition:  Follow up nurse visit EKG in 1 week and 1 month follow-up with me  Signed Jesly Hartmann Verne Spurr, MD, 02/16/2023 9:04 AM    [facility]  HeartCare at [facility] [address] [place], [place], [place] [address]

## 2023-02-23 ENCOUNTER — Telehealth: Payer: Self-pay

## 2023-02-23 ENCOUNTER — Ambulatory Visit: Payer: BC Managed Care – PPO | Attending: Internal Medicine

## 2023-02-23 ENCOUNTER — Telehealth: Payer: Self-pay | Admitting: Internal Medicine

## 2023-02-23 VITALS — BP 118/82 | HR 104 | Ht 70.0 in | Wt 320.8 lb

## 2023-02-23 DIAGNOSIS — I480 Paroxysmal atrial fibrillation: Secondary | ICD-10-CM | POA: Diagnosis not present

## 2023-02-23 DIAGNOSIS — E039 Hypothyroidism, unspecified: Secondary | ICD-10-CM | POA: Diagnosis not present

## 2023-02-23 MED ORDER — DILTIAZEM HCL 90 MG PO TABS: 90.0000 mg | ORAL_TABLET | Freq: Three times a day (TID) | ORAL | 2 refills | Status: DC

## 2023-02-23 NOTE — Addendum Note (Signed)
Addended by: Carmelina Paddock on: 02/23/2023 08:56 AM   Modules accepted: Level of Service

## 2023-02-23 NOTE — Telephone Encounter (Signed)
  Patient Consent for Virtual Visit    CONSENT FOR VIRTUAL VISIT FOR:  Leslie Hunter  By participating in this virtual visit I agree to the following:  I hereby voluntarily request, consent and authorize [facility] and its employed or contracted physicians, physician assistants, nurse practitioners or other licensed health care professionals (the Practitioner), to provide me with telemedicine health care services (the "Services") as deemed necessary by the treating Practitioner. I acknowledge and consent to receive the Services by the Practitioner via telemedicine. I understand that the telemedicine visit will involve communicating with the Practitioner through live audiovisual communication technology and the disclosure of certain medical information by electronic transmission. I acknowledge that I have been given the opportunity to request an in-person assessment or other available alternative prior to the telemedicine visit and am voluntarily participating in the telemedicine visit.  I understand that I have the right to withhold or withdraw my consent to the use of telemedicine in the course of my care at any time, without affecting my right to future care or treatment, and that the Practitioner or I may terminate the telemedicine visit at any time. I understand that I have the right to inspect all information obtained and/or recorded in the course of the telemedicine visit and may receive copies of available information for a reasonable fee.  I understand that some of the potential risks of receiving the Services via telemedicine include:  Delay or interruption in medical evaluation due to technological equipment failure or disruption; Information transmitted may not be sufficient (e.g. poor resolution of images) to allow for appropriate medical decision making by the Practitioner; and/or  In rare instances, security protocols could fail, causing a breach of personal health  information.  Furthermore, I acknowledge that it is my responsibility to provide information about my medical history, conditions and care that is complete and accurate to the best of my ability. I acknowledge that Practitioner's advice, recommendations, and/or decision may be based on factors not within their control, such as incomplete or inaccurate data provided by me or distortions of diagnostic images or specimens that may result from electronic transmissions. I understand that the practice of medicine is not an exact science and that Practitioner makes no warranties or guarantees regarding treatment outcomes. I acknowledge that a copy of this consent can be made available to me via my patient portal [facility] MyChart), or I can request a printed copy by calling the office of [facility].    I understand that my insurance will be billed for this visit.   I have read or had this consent read to me. I understand the contents of this consent, which adequately explains the benefits and risks of the Services being provided via telemedicine.  I have been provided ample opportunity to ask questions regarding this consent and the Services and have had my questions answered to my satisfaction. I give my informed consent for the services to be provided through the use of telemedicine in my medical care

## 2023-02-23 NOTE — Telephone Encounter (Signed)
Patient informed and verbalized understanding of plan. 

## 2023-02-23 NOTE — Progress Notes (Signed)
Patient here for EKG. Reports no SOB or chest pains. Stated that she has taken her Levothyroxine this morning. Checked vitals. Also went over her lab work. TSH elevated. Advised her per provider to have T3 and T4 completed at [facility] this morning. Stated that Dr. Olena Leatherwood has already increased her Levothyroxine 300 mcg.

## 2023-02-23 NOTE — Telephone Encounter (Signed)
-----   Message from Vishnu P Mallipeddi sent at 02/23/2023  9:22 AM EDT ----- EKG shows Afib with RVR. Increase Diltiazem from 60 mg to 90 mg Q8h and continue metoprolol at the current dose. Will need to arrange for DCCV. Schedule telephone visit sometime this week.

## 2023-02-24 ENCOUNTER — Telehealth: Payer: Self-pay | Admitting: Internal Medicine

## 2023-02-24 ENCOUNTER — Encounter: Payer: Self-pay | Admitting: Internal Medicine

## 2023-02-24 ENCOUNTER — Ambulatory Visit: Payer: BC Managed Care – PPO | Attending: Internal Medicine | Admitting: Internal Medicine

## 2023-02-24 DIAGNOSIS — G4733 Obstructive sleep apnea (adult) (pediatric): Secondary | ICD-10-CM | POA: Diagnosis not present

## 2023-02-24 DIAGNOSIS — I10 Essential (primary) hypertension: Secondary | ICD-10-CM

## 2023-02-24 DIAGNOSIS — I480 Paroxysmal atrial fibrillation: Secondary | ICD-10-CM

## 2023-02-24 NOTE — H&P (View-Only) (Signed)
Virtual Visit via Telephone Note   Because of Leslie Hunter's co-morbid illnesses, she is at least at moderate risk for complications without adequate follow up.  This format is felt to be most appropriate for this patient at this time.  The patient did not have access to video technology/had technical difficulties with video requiring transitioning to audio format only (telephone).  All issues noted in this document were discussed and addressed.  No physical exam could be performed with this format.  Please refer to the patient's chart for her consent to telehealth for [facility].    Date:  02/24/2023   ID:  Leslie Hunter, DOB 03-20-1961, MRN [id number] The patient was identified using 2 identifiers.  Patient Location: Home Provider Location: Office/Clinic   PCP:  Toma Deiters, MD   [facility] Providers Cardiologist:  Marjo Bicker, MD     Evaluation Performed:  Follow-Up Visit  Chief Complaint: Schedule for DCCV  History of Present Illness:    Leslie Hunter is a 62 y.o. female with paroxysmal A-fib s/p DCCV in 08/2022, hypothyroidism is here for follow-up visit.  Patient underwent successful DCCV in 08/2022 with conversion to NSR, did not tolerate amiodarone after DCCV (made her feel sick). Echocardiogram from 2023 showed normal LVEF, LA moderately dilated with mild MR, RA mild to moderately dilated, RV mildly dilated with normal RV function.  She started to feel poorly for the last 3 weeks, EKG in October 2024 showed A-fib with RVR, HR 107 bpm.  Rate controlling medications were adjusted and per EKG from the nurse visit yesterday showed A-fib with RVR, HR 104 bpm.  Diltiazem dose was increased from 60 mg to 90 mg to be taken every 8 hours in addition to metoprolol.  She continues to have similar symptoms.  Has fatigue, palpitations when she exerts.  No orthopnea, PND, leg swelling.  No angina.   Past Medical History:  Diagnosis Date   Depression    HTN,  goal below 130/80 07/09/2022   Hypothyroidism    Past Surgical History:  Procedure Laterality Date   CARDIOVERSION N/A 08/14/2022   Procedure: CARDIOVERSION;  Surgeon: Antoine Poche, MD;  Location: [facility];  Service: Endoscopy;  Laterality: N/A;   COLONOSCOPY N/A 11/23/2013   Procedure: COLONOSCOPY;  Surgeon: Corbin Ade, MD;  Location: [facility];  Service: Endoscopy;  Laterality: N/A;  1:45 PM   SPLENECTOMY N/A    ITP   TONSILLECTOMY  1984     Current Meds  Medication Sig   diltiazem (CARDIZEM) 90 MG tablet Take 1 tablet (90 mg total) by mouth every 8 (eight) hours.   DULoxetine (CYMBALTA) 60 MG capsule Take 60 mg by mouth daily.   ELIQUIS 5 MG TABS tablet Take 1 tablet (5 mg total) by mouth 2 (two) times daily.   levothyroxine (SYNTHROID) 300 MCG tablet Take 300 mcg by mouth daily before breakfast.   losartan (COZAAR) 25 MG tablet Take 25 mg by mouth daily.   metoprolol tartrate (LOPRESSOR) 25 MG tablet Take 1 tablet (25 mg total) by mouth 2 (two) times daily.   [DISCONTINUED] TRELEGY ELLIPTA 100-62.5-25 MCG/ACT AEPB Inhale 1 puff into the lungs daily as needed (shortness of breath). (Patient not taking: Reported on 02/24/2023)     Allergies:   Other and Compazine [prochlorperazine edisylate]   Social History   Tobacco Use   Smoking status: Never   Smokeless tobacco: Never  Substance Use Topics   Alcohol use: No  Drug use: No     Family Hx: The patient's family history includes Cancer in her mother; Diabetes in her father; Hypertension in her father; Thyroid disease in her paternal aunt. There is no history of Colon cancer.  ROS:   Please see the history of present illness.     All other systems reviewed and are negative.   Prior CV studies:   The following studies were reviewed today:    Labs/Other Tests and Data Reviewed:    EKG:    Recent Labs: 08/12/2022: BUN 16; Creatinine, Ser 0.76; Hemoglobin 14.8; Platelets 391; Potassium 4.2; Sodium 133    Recent Lipid Panel No results found for: "CHOL", "TRIG", "HDL", "CHOLHDL", "LDLCALC", "LDLDIRECT"  Wt Readings from Last 3 Encounters:  02/24/23 (!) 320 lb (145.2 kg)  02/23/23 (!) 320 lb 12.8 oz (145.5 kg)  02/16/23 (!) 321 lb 3.2 oz (145.7 kg)     Risk Assessment/Calculations:     STOP-Bang Score:  6      Objective:    Vital Signs:  Ht 5\' 10"  (1.778 m)   Wt (!) 320 lb (145.2 kg)   LMP 10/03/2012   BMI 45.92 kg/m      ASSESSMENT & PLAN:    Paroxysmal atrial fibrillation with RVR: New symptoms of fatigue, palpitations x 3 to 4 weeks, EKG showed A-fib with RVR, HR 104-107 bpm despite adding diltiazem to metoprolol.  Will further increase diltiazem from 60 mg to 90 mg Q8h and continue metoprolol tartrate 25 mg daily.  Continue Eliquis 5 mg twice daily, has not missed any doses in the last 3 weeks.  Will schedule for DCCV next week.  Mildly elevated TSH but normal T3 and T4.  Informed consent for DCCV The risks and benefits of the procedure explained to the patient. The risks include, but not limited to, infection, sore throat, chest pain, unsuccessful cardioversion, cardiac arrest, possibly PPM plantation.  Patient comprehended these risks and agreed to proceed with the procedure.  OSA: Diagnosed with OSA recently, awaiting CPAP titration.  HTN, controlled: Continue above medications, continue losartan 25 mg once daily.    Time:   Today, I have spent 15 minutes with the patient with telehealth technology discussing the above problems, reviewing prior notes, echo report, labs, medications.    Follow Up:  In Person  1 month after DCCV  Signed, Marjo Bicker, MD  02/24/2023 12:36 PM    [facility]

## 2023-02-24 NOTE — Telephone Encounter (Signed)
Checking percert on the following patient for testing scheduled at [facility].    DCCV Dx: Afib on 11/01 @9 :00   Pre-op 10/25 @ 2:15 with Dr. Jenene Slicker

## 2023-02-24 NOTE — Progress Notes (Signed)
Virtual Visit via Telephone Note   Because of Leslie Hunter's co-morbid illnesses, she is at least at moderate risk for complications without adequate follow up.  This format is felt to be most appropriate for this patient at this time.  The patient did not have access to video technology/had technical difficulties with video requiring transitioning to audio format only (telephone).  All issues noted in this document were discussed and addressed.  No physical exam could be performed with this format.  Please refer to the patient's chart for her consent to telehealth for [facility].    Date:  02/24/2023   ID:  Leslie Hunter, DOB 03-20-1961, MRN [id number] The patient was identified using 2 identifiers.  Patient Location: Home Provider Location: Office/Clinic   PCP:  Toma Deiters, MD   [facility] Providers Cardiologist:  Marjo Bicker, MD     Evaluation Performed:  Follow-Up Visit  Chief Complaint: Schedule for DCCV  History of Present Illness:    Leslie Hunter is a 62 y.o. female with paroxysmal A-fib s/p DCCV in 08/2022, hypothyroidism is here for follow-up visit.  Patient underwent successful DCCV in 08/2022 with conversion to NSR, did not tolerate amiodarone after DCCV (made her feel sick). Echocardiogram from 2023 showed normal LVEF, LA moderately dilated with mild MR, RA mild to moderately dilated, RV mildly dilated with normal RV function.  She started to feel poorly for the last 3 weeks, EKG in October 2024 showed A-fib with RVR, HR 107 bpm.  Rate controlling medications were adjusted and per EKG from the nurse visit yesterday showed A-fib with RVR, HR 104 bpm.  Diltiazem dose was increased from 60 mg to 90 mg to be taken every 8 hours in addition to metoprolol.  She continues to have similar symptoms.  Has fatigue, palpitations when she exerts.  No orthopnea, PND, leg swelling.  No angina.   Past Medical History:  Diagnosis Date   Depression    HTN,  goal below 130/80 07/09/2022   Hypothyroidism    Past Surgical History:  Procedure Laterality Date   CARDIOVERSION N/A 08/14/2022   Procedure: CARDIOVERSION;  Surgeon: Antoine Poche, MD;  Location: [facility];  Service: Endoscopy;  Laterality: N/A;   COLONOSCOPY N/A 11/23/2013   Procedure: COLONOSCOPY;  Surgeon: Corbin Ade, MD;  Location: [facility];  Service: Endoscopy;  Laterality: N/A;  1:45 PM   SPLENECTOMY N/A    ITP   TONSILLECTOMY  1984     Current Meds  Medication Sig   diltiazem (CARDIZEM) 90 MG tablet Take 1 tablet (90 mg total) by mouth every 8 (eight) hours.   DULoxetine (CYMBALTA) 60 MG capsule Take 60 mg by mouth daily.   ELIQUIS 5 MG TABS tablet Take 1 tablet (5 mg total) by mouth 2 (two) times daily.   levothyroxine (SYNTHROID) 300 MCG tablet Take 300 mcg by mouth daily before breakfast.   losartan (COZAAR) 25 MG tablet Take 25 mg by mouth daily.   metoprolol tartrate (LOPRESSOR) 25 MG tablet Take 1 tablet (25 mg total) by mouth 2 (two) times daily.   [DISCONTINUED] TRELEGY ELLIPTA 100-62.5-25 MCG/ACT AEPB Inhale 1 puff into the lungs daily as needed (shortness of breath). (Patient not taking: Reported on 02/24/2023)     Allergies:   Other and Compazine [prochlorperazine edisylate]   Social History   Tobacco Use   Smoking status: Never   Smokeless tobacco: Never  Substance Use Topics   Alcohol use: No  Drug use: No     Family Hx: The patient's family history includes Cancer in her mother; Diabetes in her father; Hypertension in her father; Thyroid disease in her paternal aunt. There is no history of Colon cancer.  ROS:   Please see the history of present illness.     All other systems reviewed and are negative.   Prior CV studies:   The following studies were reviewed today:    Labs/Other Tests and Data Reviewed:    EKG:    Recent Labs: 08/12/2022: BUN 16; Creatinine, Ser 0.76; Hemoglobin 14.8; Platelets 391; Potassium 4.2; Sodium 133    Recent Lipid Panel No results found for: "CHOL", "TRIG", "HDL", "CHOLHDL", "LDLCALC", "LDLDIRECT"  Wt Readings from Last 3 Encounters:  02/24/23 (!) 320 lb (145.2 kg)  02/23/23 (!) 320 lb 12.8 oz (145.5 kg)  02/16/23 (!) 321 lb 3.2 oz (145.7 kg)     Risk Assessment/Calculations:     STOP-Bang Score:  6      Objective:    Vital Signs:  Ht 5\' 10"  (1.778 m)   Wt (!) 320 lb (145.2 kg)   LMP 10/03/2012   BMI 45.92 kg/m      ASSESSMENT & PLAN:    Paroxysmal atrial fibrillation with RVR: New symptoms of fatigue, palpitations x 3 to 4 weeks, EKG showed A-fib with RVR, HR 104-107 bpm despite adding diltiazem to metoprolol.  Will further increase diltiazem from 60 mg to 90 mg Q8h and continue metoprolol tartrate 25 mg daily.  Continue Eliquis 5 mg twice daily, has not missed any doses in the last 3 weeks.  Will schedule for DCCV next week.  Mildly elevated TSH but normal T3 and T4.  Informed consent for DCCV The risks and benefits of the procedure explained to the patient. The risks include, but not limited to, infection, sore throat, chest pain, unsuccessful cardioversion, cardiac arrest, possibly PPM plantation.  Patient comprehended these risks and agreed to proceed with the procedure.  OSA: Diagnosed with OSA recently, awaiting CPAP titration.  HTN, controlled: Continue above medications, continue losartan 25 mg once daily.    Time:   Today, I have spent 15 minutes with the patient with telehealth technology discussing the above problems, reviewing prior notes, echo report, labs, medications.    Follow Up:  In Person  1 month after DCCV  Signed, Marjo Bicker, MD  02/24/2023 12:36 PM    [facility]

## 2023-02-24 NOTE — Patient Instructions (Addendum)
Medication Instructions:  Your physician recommends that you continue on your current medications as directed. Please refer to the Current Medication list given to you today.   Labwork: None  Testing/Procedures: Your physician has recommended that you have a Cardioversion (DCCV). Electrical Cardioversion uses a jolt of electricity to your heart either through paddles or wired patches attached to your chest. This is a controlled, usually prescheduled, procedure. Defibrillation is done under light anesthesia in the hospital, and you usually go home the day of the procedure. This is done to get your heart back into a normal rhythm. You are not awake for the procedure. Please see the instruction sheet given to you today.   Follow-Up: Your physician recommends that you schedule a follow-up appointment in: 1 month  Any Other Special Instructions Will Be Listed Below (If Applicable).  If you need a refill on your cardiac medications before your next appointment, please call your pharmacy.

## 2023-02-26 NOTE — Patient Instructions (Signed)
Leslie Hunter  02/26/2023     @PREFPERIOPPHARMACY @   Your procedure is scheduled on  03/06/2023.   Report to [facility] at  0700  A.M.   Call this number if you have problems the morning of surgery:  [phone]  If you experience any cold or flu symptoms such as cough, fever, chills, shortness of breath, etc. between now and your scheduled surgery, please notify us at the above number.   Remember:      DO NOT miss any doses of eliquis before your procedure.    Do not eat after midnight.   You may drink clear liquids until 0500 am on 03/06/2023.    Clear liquids allowed are:                    Water, Carbonated beverages (diabetics please choose diet or no sugar options), Black Coffee Only (No creamer, milk or cream, including half & half and powdered creamer), and Clear Sports drink (No red color; diabetics please choose diet or no sugar options)    Take these medicines the morning of surgery with A SIP OF WATER                 diltiazem, duloxetine, levothyroxine.     Do not wear jewelry, make-up or nail polish, including gel polish,  artificial nails, or any other type of covering on natural nails (fingers and  toes).  Do not wear lotions, powders, or perfumes, or deodorant.  Do not shave 48 hours prior to surgery.  Men may shave face and neck.  Do not bring valuables to the hospital.  [facility] is not responsible for any belongings or valuables.  Contacts, dentures or bridgework may not be worn into surgery.  Leave your suitcase in the car.  After surgery it may be brought to your room.  For patients admitted to the hospital, discharge time will be determined by your treatment team.  Patients discharged the day of surgery will not be allowed to drive home and must have someone with them for 24 hours.    Special instructions:   DO NOT smoke tobacco or vape for 24 hors before your procedure.  Please read over the following fact sheets that you were  given. Anesthesia Post-op Instructions and Care and Recovery After Surgery      Electrical Cardioversion Electrical cardioversion is the delivery of a jolt of electricity to restore a normal rhythm to the heart. A rhythm that is too fast or is not regular (arrhythmia) keeps the heart from pumping blood well. There is also another type of cardioversion called a chemical (pharmacologic) cardioversion. This is when your health care provider gives you one or more medicines to bring back your regular heart rhythm. Electrical cardioversion is done as a scheduled procedure for arrhythmiasthat are not life-threatening. Electrical cardioversion may also be done in an emergency for sudden life-threatening arrhythmias. Tell a health care provider about: Any allergies you have. All medicines you are taking, including vitamins, herbs, eye drops, creams, and over-the-counter medicines. Any problems you or family members have had with sedatives or anesthesia. Any bleeding problems you have. Any surgeries you have had, including a pacemaker, defibrillator, or other implanted device. Any medical conditions you have. Whether you are pregnant or may be pregnant. What are the risks? Your provider will talk with you about risks. These include: Allergic reactions to medicines. Irritation to the skin on your chest or back where  the sticky pads (electrodes) or paddles were put during electrical cardioversion. A blood clot that breaks free and travels to other parts of your body, such as your brain. Return of a worse abnormal heart rhythm that will need to be treated with medicines, a pacemaker, or an implantable cardioverter defibrillator (ICD). What happens before the procedure? Medicines Your provider may give you: Blood-thinning medicines (anticoagulants) so your blood does not clot as easily. If your provider gives you this medicine, you may need to take it for 4 weeks before the procedure. Medicines to help  stabilize your heart rate and rhythm. Ask your provider about: Changing or stopping your regular medicines. These include any diabetes medicines or blood thinners you take. Taking medicines such as aspirin and ibuprofen. These medicines can thin your blood. Do not take them unless your provider tells you to. Taking over-the-counter medicines, vitamins, herbs, and supplements. General instructions Follow instructions from your provider about what you may eat and drink. Do not put any lotions, powders, or ointments on your chest and back for 24 hours before the procedure. They can cause problems with the electrodes or paddles used to deliver electricity to your heart. Do not wear jewelry as this can interfere with delivering electricity to your heart. If you will be going home right after the procedure, plan to have a responsible adult: Take you home from the hospital or clinic. You will not be allowed to drive. Care for you for the time you are told. Tests You may have an exam or testing. This may include: Blood labs. A transesophageal echocardiogram (TEE). What happens during the procedure?     An IV will be inserted into one of your veins. You will be given a sedative. This helps you relax. Electrodes or metal paddles will be placed on your chest. They may be placed in one of these ways: One placed on your right chest, the other on the left ribs. One placed on your chest and the other on your back. An electrical shock will be delivered. The shock briefly stops (resets) your heart rhythm. Your provider will check to see if your heart rhythm is now normal. Some people need only one shock. Some need more to restore a normal heart rhythm. The procedure may vary among providers and hospitals. What happens after the procedure? Your blood pressure, heart rate, breathing rate, and blood oxygen level will be monitored until you leave the hospital or clinic. Your heart rhythm will be watched to  make sure it does not change. This information is not intended to replace advice given to you by your health care provider. Make sure you discuss any questions you have with your health care provider. Document Revised: 12/12/2021 Document Reviewed: 12/12/2021 Elsevier Patient Education  2024 [employer] Monitored Anesthesia Care, Care After The following information offers guidance on how to care for yourself after your procedure. Your health care provider may also give you more specific instructions. If you have problems or questions, contact your health care provider. What can I expect after the procedure? After the procedure, it is common to have: Tiredness. Little or no memory about what happened during or after the procedure. Impaired judgment when it comes to making decisions. Nausea or vomiting. Some trouble with balance. Follow these instructions at home: For the time period you were told by your health care provider:  Rest. Do not participate in activities where you could fall or become injured. Do not drive or use machinery. Do not drink  alcohol. Do not take sleeping pills or medicines that cause drowsiness. Do not make important decisions or sign legal documents. Do not take care of children on your own. Medicines Take over-the-counter and prescription medicines only as told by your health care provider. If you were prescribed antibiotics, take them as told by your health care provider. Do not stop using the antibiotic even if you start to feel better. Eating and drinking Follow instructions from your health care provider about what you may eat and drink. Drink enough fluid to keep your urine pale yellow. If you vomit: Drink clear fluids slowly and in small amounts as you are able. Clear fluids include water, ice chips, low-calorie sports drinks, and fruit juice that has water added to it (diluted fruit juice). Eat light and bland foods in small amounts as you are able.  These foods include bananas, applesauce, rice, lean meats, toast, and crackers. General instructions  Have a responsible adult stay with you for the time you are told. It is important to have someone help care for you until you are awake and alert. If you have sleep apnea, surgery and some medicines can increase your risk for breathing problems. Follow instructions from your health care provider about wearing your sleep device: When you are sleeping. This includes during daytime naps. While taking prescription pain medicines, sleeping medicines, or medicines that make you drowsy. Do not use any products that contain nicotine or tobacco. These products include cigarettes, chewing tobacco, and vaping devices, such as e-cigarettes. If you need help quitting, ask your health care provider. Contact a health care provider if: You feel nauseous or vomit every time you eat or drink. You feel light-headed. You are still sleepy or having trouble with balance after 24 hours. You get a rash. You have a fever. You have redness or swelling around the IV site. Get help right away if: You have trouble breathing. You have new confusion after you get home. These symptoms may be an emergency. Get help right away. Call 911. Do not wait to see if the symptoms will go away. Do not drive yourself to the hospital. This information is not intended to replace advice given to you by your health care provider. Make sure you discuss any questions you have with your health care provider. Document Revised: 09/16/2021 Document Reviewed: 09/16/2021 Elsevier Patient Education  2024 [employer].

## 2023-02-27 ENCOUNTER — Encounter (HOSPITAL_COMMUNITY): Payer: Self-pay

## 2023-02-27 ENCOUNTER — Encounter (HOSPITAL_COMMUNITY)
Admission: RE | Admit: 2023-02-27 | Discharge: 2023-02-27 | Disposition: A | Discharge status: Home / Self Care | Payer: BC Managed Care – PPO | Source: Ambulatory Visit | Attending: Internal Medicine

## 2023-02-27 VITALS — BP 118/82 | HR 100 | Temp 97.8°F | Resp 18 | Ht 70.0 in | Wt 320.0 lb

## 2023-02-27 DIAGNOSIS — Z01818 Encounter for other preprocedural examination: Secondary | ICD-10-CM | POA: Insufficient documentation

## 2023-02-27 DIAGNOSIS — I4819 Other persistent atrial fibrillation: Secondary | ICD-10-CM | POA: Insufficient documentation

## 2023-02-27 LAB — CBC WITH DIFFERENTIAL/PLATELET
Abs Immature Granulocytes: 0.02 10*3/uL (ref 0.00–0.07)
Basophils Absolute: 0.1 10*3/uL (ref 0.0–0.1)
Basophils Relative: 1 %
Eosinophils Absolute: 0.4 10*3/uL (ref 0.0–0.5)
Eosinophils Relative: 3 %
HCT: 43.7 % (ref 36.0–46.0)
Hemoglobin: 14.4 g/dL (ref 12.0–15.0)
Immature Granulocytes: 0 %
Lymphocytes Relative: 34 %
Lymphs Abs: 3.9 10*3/uL (ref 0.7–4.0)
MCH: 30.4 pg (ref 26.0–34.0)
MCHC: 33 g/dL (ref 30.0–36.0)
MCV: 92.2 fL (ref 80.0–100.0)
Monocytes Absolute: 1.1 10*3/uL — ABNORMAL HIGH (ref 0.1–1.0)
Monocytes Relative: 10 %
Neutro Abs: 5.8 10*3/uL (ref 1.7–7.7)
Neutrophils Relative %: 52 %
Platelets: 343 10*3/uL (ref 150–400)
RBC: 4.74 MIL/uL (ref 3.87–5.11)
RDW: 13.7 % (ref 11.5–15.5)
WBC: 11.3 10*3/uL — ABNORMAL HIGH (ref 4.0–10.5)
nRBC: 0 % (ref 0.0–0.2)

## 2023-02-27 LAB — BASIC METABOLIC PANEL
Anion gap: 10 (ref 5–15)
BUN: 15 mg/dL (ref 6–20)
CO2: 21 mmol/L — ABNORMAL LOW (ref 22–32)
Calcium: 8.6 mg/dL — ABNORMAL LOW (ref 8.9–10.3)
Chloride: 105 mmol/L (ref 98–111)
Creatinine, Ser: 0.69 mg/dL (ref 0.44–1.00)
GFR, Estimated: >60 mL/min (ref >60)
Glucose, Bld: 143 mg/dL — ABNORMAL HIGH (ref 70–99)
Potassium: 3.9 mmol/L (ref 3.5–5.1)
Sodium: 136 mmol/L (ref 135–145)

## 2023-03-04 ENCOUNTER — Encounter: Payer: Self-pay | Admitting: Internal Medicine

## 2023-03-04 ENCOUNTER — Telehealth: Payer: Self-pay | Admitting: Internal Medicine

## 2023-03-04 NOTE — Telephone Encounter (Signed)
Patient c/o Palpitations:  High priority if patient c/o lightheadedness, shortness of breath, or chest pain  How long have you had palpitations/irregular HR/ Afib? Are you having the symptoms now? yes  Are you currently experiencing lightheadedness, SOB or CP? no  Do you have a history of afib (atrial fibrillation) or irregular heart rhythm? yes  Have you checked your BP or HR? (document readings if available):  no  Are you experiencing any other symptoms? no   Patient also sent a message through [employer]

## 2023-03-04 NOTE — Telephone Encounter (Signed)
Mychart message response sent to patient. Please see encounter for details.

## 2023-03-06 ENCOUNTER — Encounter (HOSPITAL_COMMUNITY)
Admission: RE | Disposition: A | Discharge status: Home / Self Care | Payer: Self-pay | Source: Home / Self Care | Attending: Internal Medicine

## 2023-03-06 ENCOUNTER — Ambulatory Visit (HOSPITAL_COMMUNITY): Payer: BC Managed Care – PPO | Admitting: Anesthesiology

## 2023-03-06 ENCOUNTER — Encounter (HOSPITAL_COMMUNITY): Payer: Self-pay | Admitting: Internal Medicine

## 2023-03-06 ENCOUNTER — Other Ambulatory Visit: Payer: Self-pay

## 2023-03-06 ENCOUNTER — Ambulatory Visit (HOSPITAL_COMMUNITY)
Admission: RE | Admit: 2023-03-06 | Discharge: 2023-03-06 | Disposition: A | Discharge status: Home / Self Care | Payer: BC Managed Care – PPO | Attending: Internal Medicine | Admitting: Internal Medicine

## 2023-03-06 DIAGNOSIS — Z9081 Acquired absence of spleen: Secondary | ICD-10-CM | POA: Insufficient documentation

## 2023-03-06 DIAGNOSIS — Z8249 Family history of ischemic heart disease and other diseases of the circulatory system: Secondary | ICD-10-CM | POA: Diagnosis not present

## 2023-03-06 DIAGNOSIS — I4819 Other persistent atrial fibrillation: Secondary | ICD-10-CM | POA: Insufficient documentation

## 2023-03-06 DIAGNOSIS — I10 Essential (primary) hypertension: Secondary | ICD-10-CM | POA: Diagnosis not present

## 2023-03-06 DIAGNOSIS — Z8349 Family history of other endocrine, nutritional and metabolic diseases: Secondary | ICD-10-CM | POA: Diagnosis not present

## 2023-03-06 DIAGNOSIS — E039 Hypothyroidism, unspecified: Secondary | ICD-10-CM | POA: Diagnosis not present

## 2023-03-06 HISTORY — PX: CARDIOVERSION: SHX1299

## 2023-03-06 SURGERY — CARDIOVERSION
Anesthesia: General

## 2023-03-06 MED ORDER — PROPOFOL 10 MG/ML IV BOLUS
INTRAVENOUS | Status: AC
Start: 2023-03-06 — End: ?
  Filled 2023-03-06: qty 20

## 2023-03-06 MED ORDER — SODIUM CHLORIDE 0.9% FLUSH
10.0000 mL | Freq: Two times a day (BID) | INTRAVENOUS | Status: DC
Start: 2023-03-06 — End: 2023-03-06
  Administered 2023-03-06: 10 mL via INTRAVENOUS

## 2023-03-06 MED ORDER — PROPOFOL 10 MG/ML IV BOLUS
INTRAVENOUS | Status: AC
  Filled 2023-03-06: qty 20

## 2023-03-06 MED ORDER — PROPOFOL 10 MG/ML IV BOLUS
INTRAVENOUS | Status: DC | PRN
  Administered 2023-03-06: 120 mg via INTRAVENOUS

## 2023-03-06 MED ORDER — DILTIAZEM HCL 60 MG PO TABS: 60.0000 mg | ORAL_TABLET | Freq: Three times a day (TID) | ORAL | 0 refills | Status: AC | PRN

## 2023-03-06 NOTE — CV Procedure (Signed)
   DIRECT CURRENT CARDIOVERSION  NAME:  Leslie Hunter    MRN: [id number] DOB:  02-01-1961    ADMIT DATE: 03/06/2023  INDICATIONS: Symptomatic atrial fibrillation  PROCEDURE:  Informed consent was obtained prior to the procedure. The patient had the defibrillator pads placed in the anterior and posterior position. Once an appropriate level of sedation was confirmed, the patient was cardioverted x 1 with 200J of biphasic synchronized energy.  The patient converted to NSR.  There were no apparent complications.  The patient had normal neuro status and respiratory status post procedure with vitals stable as recorded elsewhere.  Adequate airway was maintained throughout and vital signs monitored per protocol.  RECOMMENDATIONS: Post DCCV EKG showed NSR. Continue metoprolol tartarate 25 mg BID. Diltiazem will be switched to a PRN medication for palpitations. Continue Eliquis 5 mg BID. Did not tolerate Amiodarone in the past after DCCV in 08/2022 (made her feel sick). Will refer to Afib clinic for anti-arrhythmic options as this is her second DCCV. Weight loss recommended.   [name] Verne Spurr, MD [place]  [facility]  8:49 AM

## 2023-03-06 NOTE — Anesthesia Procedure Notes (Signed)
Date/Time: 03/06/2023 8:27 AM  Performed by: Franco Nones, CRNAPre-anesthesia Checklist: Patient identified, Emergency Drugs available, Suction available, Timeout performed and Patient being monitored Patient Re-evaluated:Patient Re-evaluated prior to induction Oxygen Delivery Method: Nasal Cannula

## 2023-03-06 NOTE — Interval H&P Note (Signed)
History and Physical Interval Note:  03/06/2023 8:48 AM  Leslie Hunter  has presented today for surgery, with the diagnosis of a-fib.  The various methods of treatment have been discussed with the patient and family. After consideration of risks, benefits and other options for treatment, the patient has consented to  Procedure(s): CARDIOVERSION (N/A) as a surgical intervention.  The patient's history has been reviewed, patient examined, no change in status, stable for surgery.  I have reviewed the patient's chart and labs.  Questions were answered to the patient's satisfaction.   Did not miss any doses of AC in the prior 3-4 weeks    Declan Adamson P Tristram Milian

## 2023-03-06 NOTE — Progress Notes (Signed)
Electrical Cardioversion Procedure Note Leslie Hunter [id number] Apr 02, 1961  Procedure: Electrical Cardioversion Indications:  Atrial Fibrillation  Procedure Details Consent: Risks of procedure as well as the alternatives and risks of each were explained to the (patient/caregiver).  Consent for procedure obtained. Time Out: Verified patient identification, verified procedure, site/side was marked, verified correct patient position, special equipment/implants available, medications/allergies/relevent history reviewed, required imaging and test results available.  Performed at [phone] 03/06/23  Patient placed on cardiac monitor, pulse oximetry, supplemental oxygen as necessary.  Sedation given:  Propofol IV Pacer pads placed anterior and posterior chest.Dr.Mallipeddi verified pads placement.  Cardioverted 1 time(s).  Cardioverted at 200J.  Evaluation Findings: Post procedure EKG shows: NSR Complications: None Patient did tolerate procedure well.   Leslie Hunter 03/06/2023, 8:48 AM

## 2023-03-06 NOTE — Anesthesia Preprocedure Evaluation (Signed)
Anesthesia Evaluation  Patient identified by MRN, date of birth, ID band Patient awake    Reviewed: Allergy & Precautions, H&P , NPO status , Patient's Chart, lab work & pertinent test results, reviewed documented beta blocker date and time   Airway Mallampati: II  TM Distance: >3 FB Neck ROM: full    Dental no notable dental hx.    Pulmonary neg pulmonary ROS, sleep apnea    Pulmonary exam normal breath sounds clear to auscultation       Cardiovascular Exercise Tolerance: Good hypertension, + dysrhythmias Atrial Fibrillation  Rhythm:irregular Rate:Normal     Neuro/Psych  PSYCHIATRIC DISORDERS  Depression    negative neurological ROS  negative psych ROS   GI/Hepatic negative GI ROS, Neg liver ROS,,,  Endo/Other  negative endocrine ROSHypothyroidism    Renal/GU negative Renal ROS  negative genitourinary   Musculoskeletal   Abdominal   Peds  Hematology negative hematology ROS (+)   Anesthesia Other Findings   Reproductive/Obstetrics negative OB ROS                             Anesthesia Physical Anesthesia Plan  ASA: 3  Anesthesia Plan: General   Post-op Pain Management:    Induction:   PONV Risk Score and Plan: Propofol infusion  Airway Management Planned:   Additional Equipment:   Intra-op Plan:   Post-operative Plan:   Informed Consent: I have reviewed the patients History and Physical, chart, labs and discussed the procedure including the risks, benefits and alternatives for the proposed anesthesia with the patient or authorized representative who has indicated his/her understanding and acceptance.     Dental Advisory Given  Plan Discussed with: CRNA  Anesthesia Plan Comments:        Anesthesia Quick Evaluation

## 2023-03-06 NOTE — Transfer of Care (Signed)
Immediate Anesthesia Transfer of Care Note  Patient: Leslie Hunter  Procedure(s) Performed: CARDIOVERSION  Patient Location: PACU  Anesthesia Type:General  Level of Consciousness: awake and patient cooperative  Airway & Oxygen Therapy: Patient Spontanous Breathing and Patient connected to nasal cannula oxygen  Post-op Assessment: Report given to RN and Post -op Vital signs reviewed and stable  Post vital signs: Reviewed and stable  Last Vitals:  Vitals Value Taken Time  BP 105/69 11/0/24  0854  Temp 97.6 03/06/23  0854  Pulse 92 03/06/23  0855  Resp 18 03/06/23  0855  SpO2 96 03/06/23  0855    Last Pain:  Vitals:   03/06/23 0727  PainSc: 0-No pain         Complications: No notable events documented.

## 2023-03-12 ENCOUNTER — Encounter (HOSPITAL_COMMUNITY): Payer: Self-pay | Admitting: Internal Medicine

## 2023-03-13 NOTE — Anesthesia Postprocedure Evaluation (Signed)
Anesthesia Post Note  Patient: Leslie Hunter  Procedure(s) Performed: CARDIOVERSION  Patient location during evaluation: Phase II Anesthesia Type: General Level of consciousness: awake Pain management: pain level controlled Vital Signs Assessment: post-procedure vital signs reviewed and stable Respiratory status: spontaneous breathing and respiratory function stable Cardiovascular status: blood pressure returned to baseline and stable Postop Assessment: no headache and no apparent nausea or vomiting Anesthetic complications: no Comments: Late entry   No notable events documented.   Last Vitals:  Vitals:   03/06/23 0953 03/06/23 0958  BP:  129/79  Pulse: 71 70  Resp: 16 16  Temp: 36.7 C 36.7 C  SpO2: 100% 98%    Last Pain:  Vitals:   03/09/23 1559  TempSrc:   PainSc: 0-No pain                 Windell Norfolk

## 2023-03-19 ENCOUNTER — Ambulatory Visit: Payer: BC Managed Care – PPO | Attending: Internal Medicine | Admitting: Internal Medicine

## 2023-03-19 ENCOUNTER — Encounter: Payer: Self-pay | Admitting: Internal Medicine

## 2023-03-19 ENCOUNTER — Telehealth: Payer: Self-pay | Admitting: Internal Medicine

## 2023-03-19 VITALS — BP 130/84 | HR 61 | Ht 70.0 in | Wt 318.0 lb

## 2023-03-19 DIAGNOSIS — I480 Paroxysmal atrial fibrillation: Secondary | ICD-10-CM | POA: Diagnosis not present

## 2023-03-19 DIAGNOSIS — Z01810 Encounter for preprocedural cardiovascular examination: Secondary | ICD-10-CM | POA: Diagnosis not present

## 2023-03-19 NOTE — Telephone Encounter (Signed)
Checking percert on the following patient for testing scheduled at [facility]    LEXISCAN 04/06/2023

## 2023-03-19 NOTE — Progress Notes (Signed)
Cardiology Office Note  Date: 03/19/2023   ID: Leslie Hunter, DOB 14-Dec-1960, MRN [id number]  PCP:  Toma Deiters, MD  Cardiologist:  Marjo Bicker, MD Electrophysiologist:  None    History of Present Illness: Leslie Hunter is a 62 y.o. female known to have persistent A-fib (newly diagnosed in 04/2022) s/p DCCV in 08/14/2022, HTN, hypothyroidism is here for follow-up visit.  Patient had recent ER visit in 04/2022 for palpitations and chest pain. EKG showed new onset atrial fibrillation, rate controlled.  Due to intermittent palpitations and fatigue x 6 months, she was scheduled for DCCV and successfully converted to NSR on 08/14/2022.  She was started on amiodarone regimen after the procedure. Her fatigue completely resolved for couple of days after DCCV but returned back again in the last 2 days with severe sluggishness due to which amiodarone was discontinued.  Patient also reported that it was making her feel sick.  Her stepdaughter passed away around 3 to 4 months ago at [facility] which brought her back into A-fib with RVR requiring DCCV in 11/24.  Post DCCV EKG showed normal sinus rhythm.  Patient is here for follow-up visit.  No symptoms, overall doing great.  No angina, DOE, palpitations, fatigue, presyncope, syncope or leg swelling.  Patient stepdaughter passed away in 06/26/2024her mother-in-law passed away the last 1 year.  Her mother passed away when she was 52 years old and her aunt raised her.  Her aunt has atrial fibrillation and CHF.  Past Medical History:  Diagnosis Date   Depression    HTN, goal below 130/80 07/09/2022   Hypothyroidism     Past Surgical History:  Procedure Laterality Date   CARDIOVERSION N/A 08/14/2022   Procedure: CARDIOVERSION;  Surgeon: Antoine Poche, MD;  Location: [facility];  Service: Endoscopy;  Laterality: N/A;   CARDIOVERSION N/A 03/06/2023   Procedure: CARDIOVERSION;  Surgeon: Marjo Bicker, MD;  Location: [facility];  Service:  Cardiovascular;  Laterality: N/A;   COLONOSCOPY N/A 11/23/2013   Procedure: COLONOSCOPY;  Surgeon: Corbin Ade, MD;  Location: [facility];  Service: Endoscopy;  Laterality: N/A;  1:45 PM   SPLENECTOMY N/A    ITP   TONSILLECTOMY  1984    Current Outpatient Medications  Medication Sig Dispense Refill   diltiazem (CARDIZEM) 60 MG tablet Take 1 tablet (60 mg total) by mouth every 8 (eight) hours as needed (palpitations, hold for BP<100 mm Hg and HR< 60 bpm). 90 tablet 0   DULoxetine (CYMBALTA) 60 MG capsule Take 60 mg by mouth daily.     ELIQUIS 5 MG TABS tablet Take 1 tablet (5 mg total) by mouth 2 (two) times daily. 60 tablet 5   levothyroxine (SYNTHROID) 300 MCG tablet Take 300 mcg by mouth daily before breakfast.     losartan (COZAAR) 25 MG tablet Take 25 mg by mouth daily.     metoprolol tartrate (LOPRESSOR) 25 MG tablet Take 1 tablet (25 mg total) by mouth 2 (two) times daily. 60 tablet 2   No current facility-administered medications for this visit.   Allergies:  Other and Compazine [prochlorperazine edisylate]   Social History: The patient  reports that she has never smoked. She has never used smokeless tobacco. She reports that she does not drink alcohol and does not use drugs.   Family History: The patient's family history includes Cancer in her mother; Diabetes in her father; Hypertension in her father; Thyroid disease in her paternal aunt.   ROS:  Please see the history of present illness. Otherwise, complete review of systems is positive for none.  All other systems are reviewed and negative.   Physical Exam: VS:  BP 130/84 (BP Location: Left Arm, Patient Position: Sitting, Cuff Size: Large)   Pulse 61   Ht 5\' 10"  (1.778 m)   Wt (!) 318 lb (144.2 kg)   LMP 10/03/2012   SpO2 95%   BMI 45.63 kg/m , BMI Body mass index is 45.63 kg/m.  Wt Readings from Last 3 Encounters:  03/19/23 (!) 318 lb (144.2 kg)  03/06/23 (!) 320 lb 1.7 oz (145.2 kg)  02/27/23 (!) 320 lb  (145.2 kg)    General: Patient appears comfortable at rest. HEENT: Conjunctiva and lids normal, oropharynx clear with moist mucosa. Neck: Supple, no elevated JVP or carotid bruits, no thyromegaly. Lungs: Clear to auscultation, nonlabored breathing at rest. Cardiac: Regular rate and rhythm, no S3 or significant systolic murmur, no pericardial rub. Abdomen: Soft, nontender, no hepatomegaly, bowel sounds present, no guarding or rebound. Extremities: No pitting edema, distal pulses 2+. Skin: Warm and dry. Musculoskeletal: No kyphosis. Neuropsychiatric: Alert and oriented x3, affect grossly appropriate.  ECG: Post DCCV EKG and today's EKG showed normal sinus rhythm  Recent Labwork: 02/27/2023: BUN 15; Creatinine, Ser 0.69; Hemoglobin 14.4; Platelets 343; Potassium 3.9; Sodium 136  No results found for: "CHOL", "TRIG", "HDL", "CHOLHDL", "VLDL", "LDLCALC", "LDLDIRECT"  Other Studies Reviewed Today: Echocardiogram in 04/2022 LVEF normal Mild MR LA moderately dilated RA mild to moderately dilated  Assessment and Plan:  Patient is a 62 year old F known to have paroxysmal atrial fibrillation (newly diagnosed in 12/23) s/p DCCV on 08/14/2022 and 03/2023 is here for follow-up visit.  1.  Paroxysmal A-fib s/p DCCV in 08/14/2022 and 03/2023 -EKG today showed normal sinus rhythm, HR 61 bpm.  Continue metoprolol tartrate 25 mg twice daily and diltiazem 60 mg every 8 hours as needed for palpitations.  Continue Eliquis 5 mg twice daily.  Diagnosed with OSA, awaiting CPAP titration, strongly encouraged to reach out to sleep medicine. -Initially plan was for the patient to be seen in the A-fib clinic for antiarrhythmic options like Tikosyn. However patient's stepdaughter passed away around 3 to 4 months ago at [facility] which brought her back into symptomatic A-fib with RVR requiring DCCV 03/2023 with successful conversion to NSR. Will cancel A-fib clinic's appointment tomorrow, obtain Lexiscan to rule out  any ischemia or scar and start flecainide based on the results. Moderate LA dilatation on echocardiogram in 2023.  Did not tolerate amiodarone in the past.  If flecainide unsuccessful, will need Tikosyn.  She is also trying hard to lose weight with weight loss medications.  2.  HTN, controlled -Continue above medications, and losartan 25 mg once daily.  3.  OSA, awaiting CPAP titration -Strongly encourage patient to reach out to sleep medicine.    I have spent a total duration of 30 minutes reviewing her notes, discussed antiarrhythmic options for A-fib (on flecainide versus Tikosyn), stressed on OSA management, reviewed her medications and documented findings note.    Disposition:  Follow up 6 months  Signed Teaghan Formica Verne Spurr, MD, 03/19/2023 12:34 PM    [facility] [address] [place], [place], [place] [address]

## 2023-03-19 NOTE — Patient Instructions (Signed)
Medication Instructions:  Your physician recommends that you continue on your current medications as directed. Please refer to the Current Medication list given to you today.   Labwork: None  Testing/Procedures: Your physician has requested that you have a lexiscan myoview. For further information please visit [email or web address] Please follow instruction sheet, as given.   Follow-Up: Your physician recommends that you schedule a follow-up appointment in: 6 months  Any Other Special Instructions Will Be Listed Below (If Applicable).  Thank you for choosing [facility]!      If you need a refill on your cardiac medications before your next appointment, please call your pharmacy.

## 2023-03-20 ENCOUNTER — Ambulatory Visit (HOSPITAL_COMMUNITY): Payer: BC Managed Care – PPO | Admitting: Internal Medicine

## 2023-04-06 ENCOUNTER — Encounter (HOSPITAL_COMMUNITY): Payer: BC Managed Care – PPO

## 2023-04-15 ENCOUNTER — Encounter (HOSPITAL_COMMUNITY): Payer: BC Managed Care – PPO

## 2023-05-06 ENCOUNTER — Other Ambulatory Visit: Payer: Self-pay | Admitting: Internal Medicine

## 2023-05-06 DIAGNOSIS — I480 Paroxysmal atrial fibrillation: Secondary | ICD-10-CM

## 2023-05-07 NOTE — Telephone Encounter (Signed)
 Prescription refill request for Eliquis  received. Indication: PAF Last office visit: 03/19/23  LULLA Maywood MD Scr: 0.69 on 02/27/23  Epic Age: 63 Weight: 144.2kg  Based on above findings Eliquis  5mg  twice daily is the appropriate dose.  Refill approved.

## 2023-06-01 ENCOUNTER — Other Ambulatory Visit: Payer: Self-pay | Admitting: Internal Medicine

## 2023-10-08 ENCOUNTER — Encounter (INDEPENDENT_AMBULATORY_CARE_PROVIDER_SITE_OTHER): Payer: Self-pay | Admitting: *Deleted

## 2023-11-01 ENCOUNTER — Other Ambulatory Visit: Payer: Self-pay | Admitting: Internal Medicine

## 2023-12-01 ENCOUNTER — Other Ambulatory Visit: Payer: Self-pay | Admitting: Internal Medicine

## 2023-12-01 DIAGNOSIS — I480 Paroxysmal atrial fibrillation: Secondary | ICD-10-CM

## 2023-12-01 NOTE — Telephone Encounter (Signed)
 Eliquis  5mg  refill request received. Patient is 63 years old, weight-144.2kg, Crea-0.69 on 02/27/23, Diagnosis-Afib, and last seen by Dr. Mallipeddi on 03/19/23. Dose is appropriate based on dosing criteria. Will send in refill to requested pharmacy.

## 2024-06-06 ENCOUNTER — Ambulatory Visit: Payer: Self-pay | Admitting: Internal Medicine
